# Patient Record
Sex: Male | Born: 1985 | Race: Black or African American | Hispanic: No | Marital: Single | Smoking: Current some day smoker
Health system: Southern US, Community
[De-identification: ages and names within clinical notes are randomized; demographics above are authoritative.]

## PROBLEM LIST (undated history)

## (undated) DIAGNOSIS — Z6841 Body Mass Index (BMI) 40.0 and over, adult: Secondary | ICD-10-CM

## (undated) DIAGNOSIS — E119 Type 2 diabetes mellitus without complications: Secondary | ICD-10-CM

---
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
## 2009-04-14 ENCOUNTER — Emergency Department (HOSPITAL_COMMUNITY): Admission: EM | Admit: 2009-04-14 | Discharge: 2009-04-14 | Payer: Self-pay | Admitting: Emergency Medicine

## 2009-04-19 ENCOUNTER — Emergency Department (HOSPITAL_COMMUNITY): Admission: EM | Admit: 2009-04-19 | Discharge: 2009-04-19 | Payer: Self-pay | Admitting: Family Medicine

## 2010-08-09 ENCOUNTER — Emergency Department (HOSPITAL_COMMUNITY)
Admission: EM | Admit: 2010-08-09 | Discharge: 2010-08-10 | Payer: Self-pay | Source: Home / Self Care | Admitting: Emergency Medicine

## 2015-09-10 ENCOUNTER — Institutional Professional Consult (permissible substitution): Payer: Self-pay | Admitting: Family Medicine

## 2015-11-18 ENCOUNTER — Ambulatory Visit (INDEPENDENT_AMBULATORY_CARE_PROVIDER_SITE_OTHER): Payer: BLUE CROSS/BLUE SHIELD | Admitting: Podiatry

## 2015-11-18 ENCOUNTER — Ambulatory Visit (INDEPENDENT_AMBULATORY_CARE_PROVIDER_SITE_OTHER): Payer: BLUE CROSS/BLUE SHIELD

## 2015-11-18 ENCOUNTER — Encounter: Payer: Self-pay | Admitting: Podiatry

## 2015-11-18 VITALS — BP 139/87 | HR 100 | Resp 16 | Ht 75.0 in | Wt 380.0 lb

## 2015-11-18 DIAGNOSIS — B351 Tinea unguium: Secondary | ICD-10-CM

## 2015-11-18 DIAGNOSIS — M79604 Pain in right leg: Secondary | ICD-10-CM

## 2015-11-18 DIAGNOSIS — M779 Enthesopathy, unspecified: Secondary | ICD-10-CM

## 2015-11-18 DIAGNOSIS — L84 Corns and callosities: Secondary | ICD-10-CM

## 2015-11-18 DIAGNOSIS — M79674 Pain in right toe(s): Secondary | ICD-10-CM

## 2015-11-18 DIAGNOSIS — M79675 Pain in left toe(s): Secondary | ICD-10-CM | POA: Diagnosis not present

## 2015-11-18 DIAGNOSIS — M79605 Pain in left leg: Secondary | ICD-10-CM

## 2015-11-18 MED ORDER — TERBINAFINE HCL 250 MG PO TABS: 250.0000 mg | ORAL_TABLET | Freq: Every day | ORAL | Status: DC

## 2015-11-18 NOTE — Progress Notes (Signed)
   Subjective:    Patient ID: Rodney Delgado, male    DOB: [birth year], 30 y.o.   MRN: [id number]  HPI Patient presents with bilateral callouses; plantar forefoot; x1 yr.  Patient also presents with bilateral foot pain; plantar forefoot; Since Aug. 2016.  Patient also presents with bilaeral skin that is itching on bottom of feet; Since Aug. 2016.   Review of Systems  Musculoskeletal: Positive for gait problem.  All other systems reviewed and are negative.      Objective:   Physical Exam        Assessment & Plan:

## 2015-11-20 NOTE — Progress Notes (Signed)
Subjective:     Patient ID: Rodney Delgado, male   DOB: 03/24/1986, 30 y.o.   MRN: [id number]  HPI obese male presents with severe thickening of the plantar tissue bilateral nail disease bilateral and pain in his forefeet bilateral. States it's is been going on for a long time and getting worse   Review of Systems  All other systems reviewed and are negative.      Objective:   Physical Exam  Constitutional: He is oriented to person, place, and time.  Cardiovascular: Intact distal pulses.   Musculoskeletal: Normal range of motion.  Neurological: He is oriented to person, place, and time.  Skin: Skin is warm.  Nursing note and vitals reviewed.  Neurovascular status intact muscle strength adequate range of motion within normal limits with patient found to have significant keratotic lesion formation of the plantar third metatarsal bilateral and also noted to have generalized thick and tight skin. Found to have severe nails with thickness in yellow brittle debris with pain of nailbeds 1-5 both feet and states it's been this way for a number of years and gradually becoming more of an issue for him. 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 Debridement of lesions accomplished and debridement of nailbeds accomplished with no iatrogenic bleeding noted and we will place him on Lamisil 250 mg 1 per day for the next 90 days and we are getting a liver function studies done prior. Hopefully this will help clear up his skin and possibly a portion of his nails even though I do not expect complete clearance

## 2015-11-22 LAB — HEPATIC FUNCTION PANEL
ALT: 31 U/L (ref 9–46)
AST: 21 U/L (ref 10–40)
Albumin: 4.2 g/dL (ref 3.6–5.1)
Alkaline Phosphatase: 79 U/L (ref 40–115)
BILIRUBIN DIRECT: 0.1 mg/dL (ref <=0.2)
BILIRUBIN TOTAL: 0.3 mg/dL (ref 0.2–1.2)
Indirect Bilirubin: 0.2 mg/dL (ref 0.2–1.2)
Total Protein: 6.9 g/dL (ref 6.1–8.1)

## 2015-11-27 ENCOUNTER — Encounter: Payer: Self-pay | Admitting: *Deleted

## 2015-11-27 ENCOUNTER — Telehealth: Payer: Self-pay | Admitting: *Deleted

## 2015-11-27 NOTE — Telephone Encounter (Signed)
Dr. Charlsie Merlesegal reviewed pt's Hepatic function 11/21/2015 and states may begin terbinafine.  Pt's contact number is not accepting calls, mailed note informing pt he may begin the medication.

## 2016-02-24 ENCOUNTER — Ambulatory Visit: Payer: BLUE CROSS/BLUE SHIELD | Admitting: Podiatry

## 2016-08-22 DIAGNOSIS — N529 Male erectile dysfunction, unspecified: Secondary | ICD-10-CM | POA: Insufficient documentation

## 2017-01-19 DIAGNOSIS — Z0000 Encounter for general adult medical examination without abnormal findings: Secondary | ICD-10-CM | POA: Insufficient documentation

## 2017-02-06 DIAGNOSIS — Z6841 Body Mass Index (BMI) 40.0 and over, adult: Secondary | ICD-10-CM

## 2017-02-06 DIAGNOSIS — E781 Pure hyperglyceridemia: Secondary | ICD-10-CM | POA: Insufficient documentation

## 2017-02-06 DIAGNOSIS — E1165 Type 2 diabetes mellitus with hyperglycemia: Secondary | ICD-10-CM | POA: Insufficient documentation

## 2017-11-22 ENCOUNTER — Encounter (HOSPITAL_COMMUNITY): Payer: Self-pay

## 2017-11-22 ENCOUNTER — Other Ambulatory Visit: Payer: Self-pay

## 2017-11-22 DIAGNOSIS — E1165 Type 2 diabetes mellitus with hyperglycemia: Secondary | ICD-10-CM | POA: Diagnosis present

## 2017-11-22 DIAGNOSIS — Z794 Long term (current) use of insulin: Secondary | ICD-10-CM

## 2017-11-22 DIAGNOSIS — Z87892 Personal history of anaphylaxis: Secondary | ICD-10-CM

## 2017-11-22 DIAGNOSIS — Z880 Allergy status to penicillin: Secondary | ICD-10-CM

## 2017-11-22 DIAGNOSIS — L0231 Cutaneous abscess of buttock: Secondary | ICD-10-CM | POA: Diagnosis present

## 2017-11-22 DIAGNOSIS — Z888 Allergy status to other drugs, medicaments and biological substances status: Secondary | ICD-10-CM

## 2017-11-22 DIAGNOSIS — L02215 Cutaneous abscess of perineum: Principal | ICD-10-CM | POA: Diagnosis present

## 2017-11-22 DIAGNOSIS — L03317 Cellulitis of buttock: Secondary | ICD-10-CM | POA: Diagnosis present

## 2017-11-22 DIAGNOSIS — Z6841 Body Mass Index (BMI) 40.0 and over, adult: Secondary | ICD-10-CM

## 2017-11-22 DIAGNOSIS — F17200 Nicotine dependence, unspecified, uncomplicated: Secondary | ICD-10-CM | POA: Diagnosis present

## 2017-11-22 NOTE — ED Triage Notes (Signed)
Patient presents with right buttocks abscess x4 days. Patient reports he usually gets these abscesses "and I use hot water to bring it to a head, then it pops, but not this one." Patient reports trying to use a needle to pop the abscess today, but reports he only saw blood come from the site, so he decided to come in. Patient denies fevers/chills. Patient denies nausea/vomiting/diarrhea.

## 2017-11-23 ENCOUNTER — Observation Stay (HOSPITAL_COMMUNITY): Payer: BLUE CROSS/BLUE SHIELD | Admitting: Certified Registered"

## 2017-11-23 ENCOUNTER — Encounter (HOSPITAL_COMMUNITY): Payer: Self-pay

## 2017-11-23 ENCOUNTER — Encounter (HOSPITAL_COMMUNITY)
Admission: EM | Disposition: A | Discharge status: Home / Self Care | Payer: Self-pay | Source: Home / Self Care | Attending: Internal Medicine

## 2017-11-23 ENCOUNTER — Emergency Department (HOSPITAL_COMMUNITY): Payer: BLUE CROSS/BLUE SHIELD

## 2017-11-23 ENCOUNTER — Inpatient Hospital Stay (HOSPITAL_COMMUNITY)
Admission: EM | Admit: 2017-11-23 | Discharge: 2017-11-25 | DRG: 603 | Disposition: A | Discharge status: Home / Self Care | Payer: BLUE CROSS/BLUE SHIELD | Attending: Internal Medicine | Admitting: Internal Medicine

## 2017-11-23 DIAGNOSIS — L0231 Cutaneous abscess of buttock: Secondary | ICD-10-CM | POA: Diagnosis present

## 2017-11-23 DIAGNOSIS — L03317 Cellulitis of buttock: Secondary | ICD-10-CM

## 2017-11-23 DIAGNOSIS — E119 Type 2 diabetes mellitus without complications: Secondary | ICD-10-CM

## 2017-11-23 HISTORY — DX: Body Mass Index (BMI) 40.0 and over, adult: Z684

## 2017-11-23 HISTORY — DX: Morbid (severe) obesity due to excess calories: E66.01

## 2017-11-23 HISTORY — DX: Type 2 diabetes mellitus without complications: E11.9

## 2017-11-23 HISTORY — PX: IRRIGATION AND DEBRIDEMENT ABSCESS: SHX5252

## 2017-11-23 LAB — I-STAT CHEM 8, ED
BUN: 9 mg/dL (ref 6–20)
CHLORIDE: 100 mmol/L — AB (ref 101–111)
Calcium, Ion: 1.21 mmol/L (ref 1.15–1.40)
Creatinine, Ser: 0.8 mg/dL (ref 0.61–1.24)
Glucose, Bld: 152 mg/dL — ABNORMAL HIGH (ref 65–99)
HCT: 46 % (ref 39.0–52.0)
Hemoglobin: 15.6 g/dL (ref 13.0–17.0)
POTASSIUM: 3.6 mmol/L (ref 3.5–5.1)
SODIUM: 141 mmol/L (ref 135–145)
TCO2: 29 mmol/L (ref 22–32)

## 2017-11-23 LAB — CBC WITH DIFFERENTIAL/PLATELET
BASOS ABS: 0 10*3/uL (ref 0.0–0.1)
BASOS PCT: 0 %
EOS ABS: 0.3 10*3/uL (ref 0.0–0.7)
EOS PCT: 3 %
HCT: 42.3 % (ref 39.0–52.0)
Hemoglobin: 14.3 g/dL (ref 13.0–17.0)
Lymphocytes Relative: 30 %
Lymphs Abs: 3.6 10*3/uL (ref 0.7–4.0)
MCH: 28.5 pg (ref 26.0–34.0)
MCHC: 33.8 g/dL (ref 30.0–36.0)
MCV: 84.3 fL (ref 78.0–100.0)
Monocytes Absolute: 1.1 10*3/uL — ABNORMAL HIGH (ref 0.1–1.0)
Monocytes Relative: 9 %
Neutro Abs: 6.8 10*3/uL (ref 1.7–7.7)
Neutrophils Relative %: 58 %
PLATELETS: 276 10*3/uL (ref 150–400)
RBC: 5.02 MIL/uL (ref 4.22–5.81)
RDW: 12.7 % (ref 11.5–15.5)
WBC: 11.9 10*3/uL — AB (ref 4.0–10.5)

## 2017-11-23 LAB — GLUCOSE, CAPILLARY
GLUCOSE-CAPILLARY: 144 mg/dL — AB (ref 65–99)
GLUCOSE-CAPILLARY: 179 mg/dL — AB (ref 65–99)
Glucose-Capillary: 159 mg/dL — ABNORMAL HIGH (ref 65–99)
Glucose-Capillary: 183 mg/dL — ABNORMAL HIGH (ref 65–99)
Glucose-Capillary: 124 mg/dL — ABNORMAL HIGH (ref 65–99)
Glucose-Capillary: 110 mg/dL — ABNORMAL HIGH (ref 65–99)

## 2017-11-23 LAB — SURGICAL PCR SCREEN
MRSA, PCR: NEGATIVE
STAPHYLOCOCCUS AUREUS: POSITIVE — AB

## 2017-11-23 LAB — CBG MONITORING, ED
GLUCOSE-CAPILLARY: 157 mg/dL — AB (ref 65–99)
Glucose-Capillary: 180 mg/dL — ABNORMAL HIGH (ref 65–99)
Glucose-Capillary: 171 mg/dL — ABNORMAL HIGH (ref 65–99)

## 2017-11-23 LAB — HEMOGLOBIN A1C
HEMOGLOBIN A1C: 7.5 % — AB (ref 4.8–5.6)
MEAN PLASMA GLUCOSE: 168.55 mg/dL

## 2017-11-23 LAB — HIV ANTIBODY (ROUTINE TESTING W REFLEX): HIV SCREEN 4TH GENERATION: NONREACTIVE

## 2017-11-23 SURGERY — IRRIGATION AND DEBRIDEMENT ABSCESS
Anesthesia: General

## 2017-11-23 MED ORDER — IOPAMIDOL (ISOVUE-300) INJECTION 61%
INTRAVENOUS | Status: AC
  Filled 2017-11-23: qty 100

## 2017-11-23 MED ORDER — LIDOCAINE 2% (20 MG/ML) 5 ML SYRINGE
INTRAMUSCULAR | Status: AC
  Filled 2017-11-23: qty 5

## 2017-11-23 MED ORDER — PROPOFOL 10 MG/ML IV BOLUS
INTRAVENOUS | Status: AC
  Filled 2017-11-23: qty 20

## 2017-11-23 MED ORDER — OXYCODONE HCL 5 MG PO TABS
5.0000 mg | ORAL_TABLET | ORAL | Status: DC | PRN
  Administered 2017-11-25: 5 mg via ORAL
  Filled 2017-11-23: qty 1

## 2017-11-23 MED ORDER — MORPHINE SULFATE (PF) 2 MG/ML IV SOLN
2.0000 mg | INTRAVENOUS | Status: DC | PRN
  Administered 2017-11-23: 2 mg via INTRAVENOUS
  Filled 2017-11-23: qty 1

## 2017-11-23 MED ORDER — INSULIN ASPART 100 UNIT/ML ~~LOC~~ SOLN
0.0000 [IU] | SUBCUTANEOUS | Status: DC
  Administered 2017-11-23 (×2): 2 [IU] via SUBCUTANEOUS
  Administered 2017-11-23 (×2): 1 [IU] via SUBCUTANEOUS
  Administered 2017-11-23 – 2017-11-25 (×9): 2 [IU] via SUBCUTANEOUS
  Administered 2017-11-25: 1 [IU] via SUBCUTANEOUS
  Administered 2017-11-25: 2 [IU] via SUBCUTANEOUS
  Filled 2017-11-23: qty 1

## 2017-11-23 MED ORDER — VANCOMYCIN HCL IN DEXTROSE 1-5 GM/200ML-% IV SOLN
1000.0000 mg | Freq: Once | INTRAVENOUS | Status: AC
  Administered 2017-11-23: 1000 mg via INTRAVENOUS
  Filled 2017-11-23: qty 200

## 2017-11-23 MED ORDER — HYDROMORPHONE HCL 1 MG/ML IJ SOLN: 1.0000 mg | INTRAMUSCULAR | Status: DC | PRN

## 2017-11-23 MED ORDER — FENTANYL CITRATE (PF) 100 MCG/2ML IJ SOLN
INTRAMUSCULAR | Status: AC
  Filled 2017-11-23: qty 2

## 2017-11-23 MED ORDER — DEXAMETHASONE SODIUM PHOSPHATE 10 MG/ML IJ SOLN
INTRAMUSCULAR | Status: AC
  Filled 2017-11-23: qty 1

## 2017-11-23 MED ORDER — IOPAMIDOL (ISOVUE-300) INJECTION 61%
100.0000 mL | Freq: Once | INTRAVENOUS | Status: AC | PRN
  Administered 2017-11-23: 100 mL via INTRAVENOUS

## 2017-11-23 MED ORDER — PREMIER PROTEIN SHAKE
11.0000 [oz_av] | ORAL | Status: DC
  Administered 2017-11-24 – 2017-11-25 (×2): 11 [oz_av] via ORAL

## 2017-11-23 MED ORDER — ADULT MULTIVITAMIN W/MINERALS CH
1.0000 | ORAL_TABLET | Freq: Every day | ORAL | Status: DC
  Administered 2017-11-24 – 2017-11-25 (×2): 1 via ORAL
  Filled 2017-11-23 (×2): qty 1

## 2017-11-23 MED ORDER — LIDOCAINE HCL (CARDIAC) 20 MG/ML IV SOLN
INTRAVENOUS | Status: DC | PRN
  Administered 2017-11-23: 30 mg via INTRAVENOUS

## 2017-11-23 MED ORDER — MIDAZOLAM HCL 2 MG/2ML IJ SOLN: 0.5000 mg | Freq: Once | INTRAMUSCULAR | Status: DC | PRN

## 2017-11-23 MED ORDER — VANCOMYCIN HCL 10 G IV SOLR
1250.0000 mg | Freq: Three times a day (TID) | INTRAVENOUS | Status: DC
  Administered 2017-11-23 – 2017-11-24 (×3): 1250 mg via INTRAVENOUS
  Filled 2017-11-23 (×6): qty 1250

## 2017-11-23 MED ORDER — ACETAMINOPHEN 325 MG PO TABS: 650.0000 mg | ORAL_TABLET | Freq: Four times a day (QID) | ORAL | Status: DC | PRN

## 2017-11-23 MED ORDER — CHLORHEXIDINE GLUCONATE CLOTH 2 % EX PADS
6.0000 | MEDICATED_PAD | Freq: Every day | CUTANEOUS | Status: DC
  Administered 2017-11-23 – 2017-11-25 (×3): 6 via TOPICAL

## 2017-11-23 MED ORDER — ONDANSETRON HCL 4 MG/2ML IJ SOLN: 4.0000 mg | Freq: Four times a day (QID) | INTRAMUSCULAR | Status: DC | PRN

## 2017-11-23 MED ORDER — MIDAZOLAM HCL 2 MG/2ML IJ SOLN
INTRAMUSCULAR | Status: AC
  Filled 2017-11-23: qty 2

## 2017-11-23 MED ORDER — ONDANSETRON HCL 4 MG/2ML IJ SOLN
INTRAMUSCULAR | Status: DC | PRN
Start: 2017-11-23 — End: 2017-11-23
  Administered 2017-11-23: 4 mg via INTRAVENOUS

## 2017-11-23 MED ORDER — HYDROMORPHONE HCL 1 MG/ML IJ SOLN
0.2500 mg | INTRAMUSCULAR | Status: DC | PRN
  Administered 2017-11-23: 0.5 mg via INTRAVENOUS

## 2017-11-23 MED ORDER — SODIUM CHLORIDE 0.9 % IV SOLN
INTRAVENOUS | Status: DC
  Administered 2017-11-23: 1000 mL via INTRAVENOUS
  Administered 2017-11-23: 03:00:00 via INTRAVENOUS
  Administered 2017-11-23: 1000 mL via INTRAVENOUS
  Administered 2017-11-23: 11:00:00 via INTRAVENOUS
  Administered 2017-11-24: 1000 mL via INTRAVENOUS
  Administered 2017-11-24: 18:00:00 via INTRAVENOUS

## 2017-11-23 MED ORDER — JUVEN PO PACK
1.0000 | PACK | Freq: Two times a day (BID) | ORAL | Status: DC
  Administered 2017-11-24 – 2017-11-25 (×4): 1 via ORAL
  Filled 2017-11-23 (×5): qty 1

## 2017-11-23 MED ORDER — ONDANSETRON HCL 4 MG PO TABS: 4.0000 mg | ORAL_TABLET | Freq: Four times a day (QID) | ORAL | Status: DC | PRN

## 2017-11-23 MED ORDER — MIDAZOLAM HCL 5 MG/5ML IJ SOLN
INTRAMUSCULAR | Status: DC | PRN
  Administered 2017-11-23 (×2): 1 mg via INTRAVENOUS

## 2017-11-23 MED ORDER — PROPOFOL 10 MG/ML IV BOLUS
INTRAVENOUS | Status: DC | PRN
  Administered 2017-11-23: 350 mg via INTRAVENOUS

## 2017-11-23 MED ORDER — FENTANYL CITRATE (PF) 100 MCG/2ML IJ SOLN
INTRAMUSCULAR | Status: DC | PRN
  Administered 2017-11-23 (×2): 50 ug via INTRAVENOUS

## 2017-11-23 MED ORDER — MUPIROCIN 2 % EX OINT
1.0000 "application " | TOPICAL_OINTMENT | Freq: Two times a day (BID) | CUTANEOUS | Status: DC
  Administered 2017-11-23 – 2017-11-25 (×4): 1 via NASAL
  Filled 2017-11-23 (×3): qty 22

## 2017-11-23 MED ORDER — HYDROMORPHONE HCL 1 MG/ML IJ SOLN
INTRAMUSCULAR | Status: AC
  Filled 2017-11-23: qty 1

## 2017-11-23 MED ORDER — PROMETHAZINE HCL 25 MG/ML IJ SOLN: 6.2500 mg | INTRAMUSCULAR | Status: DC | PRN

## 2017-11-23 MED ORDER — ACETAMINOPHEN 650 MG RE SUPP: 650.0000 mg | Freq: Four times a day (QID) | RECTAL | Status: DC | PRN

## 2017-11-23 MED ORDER — DEXAMETHASONE SODIUM PHOSPHATE 10 MG/ML IJ SOLN
INTRAMUSCULAR | Status: DC | PRN
  Administered 2017-11-23: 10 mg via INTRAVENOUS

## 2017-11-23 MED ORDER — LEVOFLOXACIN IN D5W 750 MG/150ML IV SOLN
750.0000 mg | Freq: Once | INTRAVENOUS | Status: AC
  Administered 2017-11-23: 750 mg via INTRAVENOUS
  Filled 2017-11-23: qty 150

## 2017-11-23 MED ORDER — MEPERIDINE HCL 50 MG/ML IJ SOLN: 6.2500 mg | INTRAMUSCULAR | Status: DC | PRN

## 2017-11-23 MED ORDER — VANCOMYCIN HCL 10 G IV SOLR
1500.0000 mg | INTRAVENOUS | Status: AC
  Administered 2017-11-23: 1500 mg via INTRAVENOUS
  Filled 2017-11-23: qty 1500

## 2017-11-23 MED ORDER — ONDANSETRON HCL 4 MG/2ML IJ SOLN
INTRAMUSCULAR | Status: AC
  Filled 2017-11-23: qty 2

## 2017-11-23 SURGICAL SUPPLY — 34 items
BLADE HEX COATED 2.75 (ELECTRODE) ×3 IMPLANT
BLADE SURG SZ10 CARB STEEL (BLADE) ×3 IMPLANT
CHLORAPREP W/TINT 26ML (MISCELLANEOUS) ×3 IMPLANT
CLOSURE WOUND 1/2 X4 (GAUZE/BANDAGES/DRESSINGS)
COVER SURGICAL LIGHT HANDLE (MISCELLANEOUS) ×3 IMPLANT
DECANTER SPIKE VIAL GLASS SM (MISCELLANEOUS) IMPLANT
DRAIN CHANNEL RND F F (WOUND CARE) IMPLANT
DRAPE LAPAROTOMY T 102X78X121 (DRAPES) IMPLANT
DRAPE LAPAROTOMY TRNSV 102X78 (DRAPE) IMPLANT
DRAPE LEGGING IMPERMEABLE (DRAPES) ×3 IMPLANT
DRAPE SHEET LG 3/4 BI-LAMINATE (DRAPES) IMPLANT
ELECT REM PT RETURN 15FT ADLT (MISCELLANEOUS) ×3 IMPLANT
EVACUATOR SILICONE 100CC (DRAIN) IMPLANT
GAUZE PACKING IODOFORM 1/4X15 (GAUZE/BANDAGES/DRESSINGS) ×3 IMPLANT
GAUZE SPONGE 4X4 12PLY STRL (GAUZE/BANDAGES/DRESSINGS) ×3 IMPLANT
GLOVE BIOGEL PI IND STRL 7.0 (GLOVE) ×1 IMPLANT
GLOVE BIOGEL PI INDICATOR 7.0 (GLOVE) ×2
GLOVE SURG ORTHO 8.0 STRL STRW (GLOVE) ×3 IMPLANT
GOWN STRL REUS W/TWL LRG LVL3 (GOWN DISPOSABLE) ×3 IMPLANT
GOWN STRL REUS W/TWL XL LVL3 (GOWN DISPOSABLE) ×6 IMPLANT
KIT BASIN OR (CUSTOM PROCEDURE TRAY) ×3 IMPLANT
MARKER SKIN DUAL TIP RULER LAB (MISCELLANEOUS) IMPLANT
NEEDLE HYPO 25X1 1.5 SAFETY (NEEDLE) ×3 IMPLANT
NS IRRIG 1000ML POUR BTL (IV SOLUTION) ×3 IMPLANT
PACK GENERAL/GYN (CUSTOM PROCEDURE TRAY) ×3 IMPLANT
PAD ABD 8X10 STRL (GAUZE/BANDAGES/DRESSINGS) ×3 IMPLANT
SPONGE LAP 18X18 X RAY DECT (DISPOSABLE) IMPLANT
STAPLER VISISTAT 35W (STAPLE) IMPLANT
STRIP CLOSURE SKIN 1/2X4 (GAUZE/BANDAGES/DRESSINGS) IMPLANT
SUT ETHILON 3 0 PS 1 (SUTURE) IMPLANT
SUT MNCRL AB 4-0 PS2 18 (SUTURE) IMPLANT
SUT VIC AB 3-0 SH 18 (SUTURE) IMPLANT
SYR CONTROL 10ML LL (SYRINGE) ×3 IMPLANT
TOWEL OR 17X26 10 PK STRL BLUE (TOWEL DISPOSABLE) ×3 IMPLANT

## 2017-11-23 NOTE — Progress Notes (Signed)
[facility], [facility] and [facility]. Notified surgical PA, will continue efforts until d/ced.

## 2017-11-23 NOTE — Anesthesia Postprocedure Evaluation (Signed)
Anesthesia Post Note  Patient: Rodney Delgado  Procedure(s) Performed: IRRIGATION AND DEBRIDEMENTPERINEAL ABSCESS (N/A )     Patient location during evaluation: PACU Anesthesia Type: General Level of consciousness: awake and alert, patient cooperative and oriented Pain management: pain level controlled Vital Signs Assessment: post-procedure vital signs reviewed and stable Respiratory status: spontaneous breathing, nonlabored ventilation and respiratory function stable Cardiovascular status: blood pressure returned to baseline and stable Postop Assessment: no apparent nausea or vomiting Anesthetic complications: no    Last Vitals:  Vitals:   11/23/17 1830 11/23/17 1845  BP: (!) 152/94 (!) 148/87  Pulse: 95 88  Resp: 12 15  Temp:    SpO2: 100% 100%    Last Pain:  Vitals:   11/23/17 1845  TempSrc:   PainSc: 4                  Emalene Welte,E. Jahari Wiginton

## 2017-11-23 NOTE — Anesthesia Preprocedure Evaluation (Addendum)
Anesthesia Evaluation  Patient identified by MRN, date of birth, ID band Patient awake    Reviewed: Allergy & Precautions, NPO status , Patient's Chart, lab work & pertinent test results  History of Anesthesia Complications Negative for: history of anesthetic complications  Airway Mallampati: I  TM Distance: >3 FB Neck ROM: Full    Dental  (+) Dental Advisory Given   Pulmonary Current Smoker,    breath sounds clear to auscultation       Cardiovascular (-) anginanegative cardio ROS   Rhythm:Regular Rate:Normal     Neuro/Psych negative neurological ROS     GI/Hepatic negative GI ROS, Neg liver ROS,   Endo/Other  diabetes (glu 124), Poorly ControlledMorbid obesity  Renal/GU negative Renal ROS     Musculoskeletal   Abdominal (+) + obese,   Peds  Hematology negative hematology ROS (+)   Anesthesia Other Findings   Reproductive/Obstetrics                            Anesthesia Physical Anesthesia Plan  ASA: III  Anesthesia Plan: General   Post-op Pain Management:    Induction: Intravenous  PONV Risk Score and Plan: 2 and Ondansetron and Dexamethasone  Airway Management Planned: LMA  Additional Equipment:   Intra-op Plan:   Post-operative Plan:   Informed Consent: I have reviewed the patients History and Physical, chart, labs and discussed the procedure including the risks, benefits and alternatives for the proposed anesthesia with the patient or authorized representative who has indicated his/her understanding and acceptance.   Dental advisory given  Plan Discussed with: CRNA and Surgeon  Anesthesia Plan Comments: (Plan routine monitors,GA- LMA OK)        Anesthesia Quick Evaluation

## 2017-11-23 NOTE — Op Note (Signed)
11/23/2017  6:09 PM  PATIENT:  Liz Beacherrence Brunsman  32 y.o. male  PRE-OPERATIVE DIAGNOSIS:  PERINEAL ABCESS  POST-OPERATIVE DIAGNOSIS:  PERINEAL ABCESS  PROCEDURE:  Procedure(s): IRRIGATION AND DEBRIDEMENTPERINEAL ABSCESS (N/A)  SURGEON:  Surgeon(s) and Role:    * Axel Filleramirez, Nicloe Frontera, MD - Primary   ASSISTANTS: none   ANESTHESIA:   general  EBL:  15cc   BLOOD ADMINISTERED:none  DRAINS:Quarter-inch iodoform gauze within the abscess cavity  LOCAL MEDICATIONS USED:  NONE  SPECIMEN:  Source of Specimen: Perineal abscess  DISPOSITION OF SPECIMEN: Microbiology  COUNTS:  YES  TOURNIQUET:  * No tourniquets in log *  DICTATION: .Dragon Dictation Indication procedure: Patient is a 32 year old male with a right perineal abscess.  Patient was admitted from the ER underwent CT scan which revealed a large perineal abscess.  Secondary to the this and his comorbidities patient was taken back to the operating room urgently for I&D of perineal abscess.  Details of procedure: As the patient was consented he was taken back to the operating room and placed in supine position with bilateral SCDs in place.  He underwent general endotracheal anesthesia.  HE was placed in the lithotomy position.  Patient was prepped and draped standard fashion.  A timeout was called all facts verified.  At this time the area of the abscess that was already initially draining, was opened up with a Kelly hemostat.  A large amount of foul smelling purulence was expressed.  Cultures were then taken from this area.  At this time it area appeared to track anteriorly.  Incision was made in the abscess cavity anteriorly.  Large amount of purulence was also evacuated in this region.  Hemostats were used and blunt dissection was used to break up all loculations.  At this time the area was irrigated out with sterile saline.  The area was packed with quarter-inch iodoform gauze, x1 bottle.  The area was then dressed with 4 x 4's,  ABD pad, and mesh panties.  Patient taught procedure well was taken to the recovery in stable condition.   PLAN OF CARE: Admit to inpatient   PATIENT DISPOSITION:  PACU - hemodynamically stable.   Delay start of Pharmacological VTE agent (>24hrs) due to surgical blood loss or risk of bleeding: yes

## 2017-11-23 NOTE — H&P (Signed)
History and Physical    Rodney Delgado ZOX:[id number] DOB: June 23, 1986 DOA: 11/23/2017  PCP: Patient, No Pcp Per  Patient coming from: Home  I have personally briefly reviewed patient's old medical records in [facility]  Chief Complaint: Abscess and cellulitis of buttocks  HPI: Rodney Delgado is a 32 y.o. male with medical history significant of DM2 not on meds for about a year now.  Patient presents to the ED with c/o abscess and cellulitis of his R buttock / perineal area.  Symptoms ongoing for past 4 days, constant, dull pain, worsening.  Gets occasional abscesses but usually able to draw them to a head and drain himself.  Not so this time.   ED Course: CT shows abscess.  EDP called general surgery who said to have medicine admit and they will see in AM.   Review of Systems: As per HPI otherwise 10 point review of systems negative.   Past Medical History:  Diagnosis Date  . DM2 (diabetes mellitus, type 2) (HCC)   . Morbid obesity with BMI of 40.0-44.9, adult (HCC)     No past surgical history on file.   reports that he has been smoking.  He does not have any smokeless tobacco history on file. He reports that he drinks alcohol. He reports that he does not use drugs.  Allergies  Allergen Reactions  . Nsaids Anaphylaxis  . Penicillins Rash    Has patient had a PCN reaction causing immediate rash, facial/tongue/throat swelling, SOB or lightheadedness with hypotension: Yes Has patient had a PCN reaction causing severe rash involving mucus membranes or skin necrosis: No Has patient had a PCN reaction that required hospitalization: No Has patient had a PCN reaction occurring within the last 10 years: No If all of the above answers are "NO", then may proceed with Cephalosporin use.     No family history on file.   Prior to Admission medications   Medication Sig Start Date End Date Taking? Authorizing Provider  terbinafine (LAMISIL) 250 MG tablet Take 1 tablet (250 mg  total) by mouth daily. Patient not taking: Reported on 11/23/2017 11/18/15   Lenn Sink, [place]    Physical Exam: Vitals:   11/22/17 2050 11/22/17 2052 11/23/17 0204 11/23/17 0230  BP:  (!) 166/119 132/77 133/81  Pulse:  (!) 108 100 96  Resp:  16 16 18   Temp:  99.3 F (37.4 C) 98.9 F (37.2 C)   TempSrc:  Oral Oral   SpO2:  97% 97% 94%  Weight: (!) 157.7 kg (347 lb 11.2 oz)     Height: 6\' 3"  (1.905 m)       Constitutional: NAD, calm, comfortable Eyes: PERRL, lids and conjunctivae normal ENMT: Mucous membranes are moist. Posterior pharynx clear of any exudate or lesions.Normal dentition.  Neck: normal, supple, no masses, no thyromegaly Respiratory: clear to auscultation bilaterally, no wheezing, no crackles. Normal respiratory effort. No accessory muscle use.  Cardiovascular: Regular rate and rhythm, no murmurs / rubs / gallops. No extremity edema. 2+ pedal pulses. No carotid bruits.  Abdomen: no tenderness, no masses palpated. No hepatosplenomegaly. Bowel sounds positive.  Musculoskeletal: no clubbing / cyanosis. No joint deformity upper and lower extremities. Good ROM, no contractures. Normal muscle tone.  Skin: erythema, redness, of perineum and buttocks Neurologic: CN 2-12 grossly intact. Sensation intact, DTR normal. Strength 5/5 in all 4.  Psychiatric: Normal judgment and insight. Alert and oriented x 3. Normal mood.    Labs on Admission: I have personally reviewed following  labs and imaging studies  CBC: Recent Labs  Lab 11/23/17 0116 11/23/17 0125  WBC 11.9*  --   NEUTROABS 6.8  --   HGB 14.3 15.6  HCT 42.3 46.0  MCV 84.3  --   PLT 276  --    Basic Metabolic Panel: Recent Labs  Lab 11/23/17 0125  NA 141  K 3.6  CL 100*  GLUCOSE 152*  BUN 9  CREATININE 0.80   GFR: Estimated Creatinine Clearance: 219.3 mL/min (by C-G formula based on SCr of 0.8 mg/dL). Liver Function Tests: No results for input(s): AST, ALT, ALKPHOS, BILITOT, PROT, ALBUMIN in the last  168 hours. No results for input(s): LIPASE, AMYLASE in the last 168 hours. No results for input(s): AMMONIA in the last 168 hours. Coagulation Profile: No results for input(s): INR, PROTIME in the last 168 hours. Cardiac Enzymes: No results for input(s): CKTOTAL, CKMB, CKMBINDEX, TROPONINI in the last 168 hours. BNP (last 3 results) No results for input(s): PROBNP in the last 8760 hours. HbA1C: No results for input(s): HGBA1C in the last 72 hours. CBG: Recent Labs  Lab 11/23/17 0117 11/23/17 0318  GLUCAP 157* 180*   Lipid Profile: No results for input(s): CHOL, HDL, LDLCALC, TRIG, CHOLHDL, LDLDIRECT in the last 72 hours. Thyroid Function Tests: No results for input(s): TSH, T4TOTAL, FREET4, T3FREE, THYROIDAB in the last 72 hours. Anemia Panel: No results for input(s): VITAMINB12, FOLATE, FERRITIN, TIBC, IRON, RETICCTPCT in the last 72 hours. Urine analysis: No results found for: COLORURINE, APPEARANCEUR, LABSPEC, PHURINE, GLUCOSEU, HGBUR, BILIRUBINUR, KETONESUR, PROTEINUR, UROBILINOGEN, NITRITE, LEUKOCYTESUR  Radiological Exams on Admission: Ct Abdomen Pelvis W Contrast  Result Date: 11/23/2017 CLINICAL DATA:  Right buttock abscess for 4 days. EXAM: CT ABDOMEN AND PELVIS WITH CONTRAST TECHNIQUE: Multidetector CT imaging of the abdomen and pelvis was performed using the standard protocol following bolus administration of intravenous contrast. CONTRAST:  100 cc ISOVUE-300 IOPAMIDOL (ISOVUE-300) INJECTION 61% COMPARISON:  None. FINDINGS: Lower chest: Normal heart size without pericardial effusion. Clear lung bases. Hepatobiliary: Homogeneous appearance of the liver without space-occupying mass. Normal gallbladder. No biliary dilatation. Pancreas: Normal Spleen: Normal Adrenals/Urinary Tract: Normal Stomach/Bowel: Colonic diverticulosis along the sigmoid colon without acute diverticulitis. Normal appendix. Unremarkable stomach and small intestine. Vascular/Lymphatic: Normal caliber aorta  and iliac arteries. No pelvic sidewall lymphadenopathy or inguinal adenopathy. Reproductive: Unremarkable Other: Subcutaneous abscess at the junction of the right buttock and perineum measuring 2.9 x 7.4 x 4.5 cm with cellulitic subcutaneous soft tissue induration involving the adjacent buttock. Musculoskeletal: No acute or significant osseous findings. IMPRESSION: 1. Subcutaneous abscess at the junction of the right buttock and perineum measuring 2.9 x 7.4 x 4.5 cm with adjacent cellulitis. 2. Colonic diverticulosis without acute diverticulitis. 3. No acute solid organ pathology or adenopathy. Electronically Signed   By: Tollie Ethavid  Kwon M.D.   On: 11/23/2017 02:32    EKG: Independently reviewed.  Assessment/Plan Principal Problem:   Cellulitis and abscess of buttock Active Problems:   DM2 (diabetes mellitus, type 2) (HCC)    1. Cellulitis and abscess of buttock - 1. Gen surg to see in AM 2. Will leave on Vanc for now 3. NPO 2. DM2 - 1. Sensitive SSI q4h for now.  DVT prophylaxis: SCDs Code Status: Full Family Communication: No family in room Disposition Plan: Home, likely post-op tomorrow Consults called: Dr. Ezzard StandingNewman called by EDP Admission status: Place in obs   Hillary BowGARDNER, JARED M. DO [facility] Pager [phone]  If 7AM-7PM, please contact day team taking care of patient  [email or web address] Password [facility]  11/23/2017, 3:45 AM

## 2017-11-23 NOTE — ED Notes (Signed)
Pt transported to CT ?

## 2017-11-23 NOTE — Anesthesia Procedure Notes (Signed)
Procedure Name: LMA Insertion Date/Time: 11/23/2017 5:55 PM Performed by: Thornell MuleStubblefield, Adyline Huberty G, CRNA Pre-anesthesia Checklist: Patient identified, Emergency Drugs available, Suction available and Patient being monitored Patient Re-evaluated:Patient Re-evaluated prior to induction Oxygen Delivery Method: Circle system utilized Preoxygenation: Pre-oxygenation with 100% oxygen Induction Type: IV induction LMA: LMA inserted LMA Size: 5.0 Number of attempts: 1 Placement Confirmation: positive ETCO2 and breath sounds checked- equal and bilateral Tube secured with: Tape Dental Injury: Teeth and Oropharynx as per pre-operative assessment

## 2017-11-23 NOTE — Progress Notes (Signed)
Pharmacy Antibiotic Note  Rodney Delgado is a 32 y.o. male admitted on 11/23/2017 with skin abscess.  Pharmacy has been consulted for vancomycin dosing.  Plan: Vancomycin 1 Gm + 1500 mg = 2.5 Gm load then 1250 mg IV q8h for est AUC 366  Goal AUC = 400-500 F/u scr/cultures/levels  Height: 6\' 3"  (190.5 cm) Weight: (!) 347 lb 11.2 oz (157.7 kg) IBW/kg (Calculated) : 84.5  Temp (24hrs), Avg:99.1 F (37.3 C), Min:98.9 F (37.2 C), Max:99.3 F (37.4 C)  Recent Labs  Lab 11/23/17 0116 11/23/17 0125  WBC 11.9*  --   CREATININE  --  0.80    Estimated Creatinine Clearance: 219.3 mL/min (by C-G formula based on SCr of 0.8 mg/dL).    Allergies  Allergen Reactions  . Nsaids Anaphylaxis  . Penicillins Rash    Has patient had a PCN reaction causing immediate rash, facial/tongue/throat swelling, SOB or lightheadedness with hypotension: Yes Has patient had a PCN reaction causing severe rash involving mucus membranes or skin necrosis: No Has patient had a PCN reaction that required hospitalization: No Has patient had a PCN reaction occurring within the last 10 years: No If all of the above answers are "NO", then may proceed with Cephalosporin use.     Antimicrobials this admission: 3/19 vancomycin >>    >>   Dose adjustments this admission:   Microbiology results:  BCx:   UCx:    Sputum:    MRSA PCR:   Thank you for allowing pharmacy to be a part of this patient's care.  Lorenza EvangelistGreen, Trinka Keshishyan R 11/23/2017 3:22 AM

## 2017-11-23 NOTE — Transfer of Care (Signed)
Immediate Anesthesia Transfer of Care Note  Patient: Rodney Delgado  Procedure(s) Performed: IRRIGATION AND DEBRIDEMENTPERINEAL ABSCESS (N/A )  Patient Location: PACU  Anesthesia Type:General  Level of Consciousness: drowsy, patient cooperative and responds to stimulation  Airway & Oxygen Therapy: Patient Spontanous Breathing and Patient connected to face mask oxygen  Post-op Assessment: Report given to RN and Post -op Vital signs reviewed and stable  Post vital signs: Reviewed and stable  Last Vitals:  Vitals:   11/23/17 0900 11/23/17 1416  BP: 120/74 135/87  Pulse: (!) 102 88  Resp: 20 (!) 21  Temp: 36.8 C 37.2 C  SpO2: 96% 97%    Last Pain:  Vitals:   11/23/17 1416  TempSrc: Oral  PainSc:       Patients Stated Pain Goal: 2 (11/23/17 0900)  Complications: No apparent anesthesia complications

## 2017-11-23 NOTE — Progress Notes (Signed)
Rodney Delgado is a 32 y.o. male with medical history significant of DM2 not on meds for about a year now.  Patient presents to the ED with c/o abscess and cellulitis of his R buttock / perineal area.  Symptoms ongoing for past 4 days, constant, dull pain, worsening.  Gets occasional abscesses but usually able to draw them to a head and drain himself.  Not so this time.   11/23/2017: Patient seen and examined at his bedside.  His pain is well controlled on current pain management.  CT abdomen and pelvis with contrast revealed abscess in the right buttock surrounded by cellulitis.  General surgery consulted and plans for I&D today.  Continue IV antibiotics.  Cultures will be sent out.  Please refer to H&P dictated by Dr. Julian ReilGardner on 11/23/2017 for further details of the assessment and plan.

## 2017-11-23 NOTE — ED Provider Notes (Signed)
[facility]-EMERGENCY DEPT Provider Note   CSN: [id number] Arrival date & time: 11/22/17  2047     History   Chief Complaint Chief Complaint  Patient presents with  . Abscess    HPI Rodney Delgado is a 32 y.o. male.  The history is provided by the patient.  Abscess  Location:  Pelvis Pelvic abscess location:  Perianal, gluteal cleft and scrotum Abscess quality: fluctuance, induration, painful, redness and warmth   Red streaking: no   Duration:  4 days Progression:  Worsening Pain details:    Quality:  Dull   Severity:  Moderate   Timing:  Constant   Progression:  Worsening Chronicity:  New Context: diabetes   Relieved by:  Nothing Worsened by:  Nothing Ineffective treatments:  None tried Associated symptoms: no fever and no vomiting   Risk factors: no hx of MRSA     History reviewed. No pertinent past medical history.  There are no active problems to display for this patient.   No past surgical history on file.     Home Medications    Prior to Admission medications   Medication Sig Start Date End Date Taking? Authorizing Provider  terbinafine (LAMISIL) 250 MG tablet Take 1 tablet (250 mg total) by mouth daily. Patient not taking: Reported on 11/23/2017 11/18/15   Lenn Sink, DPM    Family History No family history on file.  Social History Social History   Tobacco Use  . Smoking status: Current Some Day Smoker  Substance Use Topics  . Alcohol use: Yes    Alcohol/week: 0.0 oz    Comment: social  . Drug use: No     Allergies   Nsaids and Penicillins   Review of Systems Review of Systems  Constitutional: Negative for fever.  Respiratory: Negative for shortness of breath.   Cardiovascular: Negative for chest pain.  Gastrointestinal: Negative for diarrhea and vomiting.  Skin: Positive for color change.  All other systems reviewed and are negative.    Physical Exam Updated Vital Signs BP 133/81   Pulse 96    Temp 98.9 F (37.2 C) (Oral)   Resp 18   Ht 6\' 3"  (1.905 m)   Wt (!) 157.7 kg (347 lb 11.2 oz)   SpO2 94%   BMI 43.46 kg/m   Physical Exam  Constitutional: He is oriented to person, place, and time. He appears well-developed and well-nourished. No distress.  HENT:  Head: Normocephalic and atraumatic.  Mouth/Throat: No oropharyngeal exudate.  Eyes: Conjunctivae are normal. Pupils are equal, round, and reactive to light.  Neck: Normal range of motion. Neck supple.  Cardiovascular: Normal rate, regular rhythm, normal heart sounds and intact distal pulses.  Pulmonary/Chest: Effort normal and breath sounds normal. No stridor. He has no wheezes. He has no rales.  Abdominal: Soft. Bowel sounds are normal. He exhibits no mass. There is no tenderness. There is no rebound and no guarding.  Musculoskeletal: Normal range of motion.  Neurological: He is alert and oriented to person, place, and time.  Skin: Capillary refill takes less than 2 seconds. There is erythema.        ED Treatments / Results  Labs (all labs ordered are listed, but only abnormal results are displayed)  Results for orders placed or performed during the hospital encounter of 11/23/17  CBC with Differential/Platelet  Result Value Ref Range   WBC 11.9 (H) 4.0 - 10.5 K/uL   RBC 5.02 4.22 - 5.81 MIL/uL   Hemoglobin 14.3 13.0 -  17.0 g/dL   HCT 72.5 36.6 - 44.0 %   MCV 84.3 78.0 - 100.0 fL   MCH 28.5 26.0 - 34.0 pg   MCHC 33.8 30.0 - 36.0 g/dL   RDW 34.7 42.5 - 95.6 %   Platelets 276 150 - 400 K/uL   Neutrophils Relative % 58 %   Neutro Abs 6.8 1.7 - 7.7 K/uL   Lymphocytes Relative 30 %   Lymphs Abs 3.6 0.7 - 4.0 K/uL   Monocytes Relative 9 %   Monocytes Absolute 1.1 (H) 0.1 - 1.0 K/uL   Eosinophils Relative 3 %   Eosinophils Absolute 0.3 0.0 - 0.7 K/uL   Basophils Relative 0 %   Basophils Absolute 0.0 0.0 - 0.1 K/uL  I-Stat Chem 8, ED  Result Value Ref Range   Sodium 141 135 - 145 mmol/L   Potassium 3.6 3.5  - 5.1 mmol/L   Chloride 100 (L) 101 - 111 mmol/L   BUN 9 6 - 20 mg/dL   Creatinine, Ser 3.87 0.61 - 1.24 mg/dL   Glucose, Bld 564 (H) 65 - 99 mg/dL   Calcium, Ion 3.32 9.51 - 1.40 mmol/L   TCO2 29 22 - 32 mmol/L   Hemoglobin 15.6 13.0 - 17.0 g/dL   HCT 88.4 16.6 - 06.3 %  POC CBG, ED  Result Value Ref Range   Glucose-Capillary 157 (H) 65 - 99 mg/dL   Ct Abdomen Pelvis W Contrast  Result Date: 11/23/2017 CLINICAL DATA:  Right buttock abscess for 4 days. EXAM: CT ABDOMEN AND PELVIS WITH CONTRAST TECHNIQUE: Multidetector CT imaging of the abdomen and pelvis was performed using the standard protocol following bolus administration of intravenous contrast. CONTRAST:  100 cc ISOVUE-300 IOPAMIDOL (ISOVUE-300) INJECTION 61% COMPARISON:  None. FINDINGS: Lower chest: Normal heart size without pericardial effusion. Clear lung bases. Hepatobiliary: Homogeneous appearance of the liver without space-occupying mass. Normal gallbladder. No biliary dilatation. Pancreas: Normal Spleen: Normal Adrenals/Urinary Tract: Normal Stomach/Bowel: Colonic diverticulosis along the sigmoid colon without acute diverticulitis. Normal appendix. Unremarkable stomach and small intestine. Vascular/Lymphatic: Normal caliber aorta and iliac arteries. No pelvic sidewall lymphadenopathy or inguinal adenopathy. Reproductive: Unremarkable Other: Subcutaneous abscess at the junction of the right buttock and perineum measuring 2.9 x 7.4 x 4.5 cm with cellulitic subcutaneous soft tissue induration involving the adjacent buttock. Musculoskeletal: No acute or significant osseous findings. IMPRESSION: 1. Subcutaneous abscess at the junction of the right buttock and perineum measuring 2.9 x 7.4 x 4.5 cm with adjacent cellulitis. 2. Colonic diverticulosis without acute diverticulitis. 3. No acute solid organ pathology or adenopathy. Electronically Signed   By: Tollie Eth M.D.   On: 11/23/2017 02:32    Radiology Ct Abdomen Pelvis W  Contrast  Result Date: 11/23/2017 CLINICAL DATA:  Right buttock abscess for 4 days. EXAM: CT ABDOMEN AND PELVIS WITH CONTRAST TECHNIQUE: Multidetector CT imaging of the abdomen and pelvis was performed using the standard protocol following bolus administration of intravenous contrast. CONTRAST:  100 cc ISOVUE-300 IOPAMIDOL (ISOVUE-300) INJECTION 61% COMPARISON:  None. FINDINGS: Lower chest: Normal heart size without pericardial effusion. Clear lung bases. Hepatobiliary: Homogeneous appearance of the liver without space-occupying mass. Normal gallbladder. No biliary dilatation. Pancreas: Normal Spleen: Normal Adrenals/Urinary Tract: Normal Stomach/Bowel: Colonic diverticulosis along the sigmoid colon without acute diverticulitis. Normal appendix. Unremarkable stomach and small intestine. Vascular/Lymphatic: Normal caliber aorta and iliac arteries. No pelvic sidewall lymphadenopathy or inguinal adenopathy. Reproductive: Unremarkable Other: Subcutaneous abscess at the junction of the right buttock and perineum measuring 2.9 x 7.4 x  4.5 cm with cellulitic subcutaneous soft tissue induration involving the adjacent buttock. Musculoskeletal: No acute or significant osseous findings. IMPRESSION: 1. Subcutaneous abscess at the junction of the right buttock and perineum measuring 2.9 x 7.4 x 4.5 cm with adjacent cellulitis. 2. Colonic diverticulosis without acute diverticulitis. 3. No acute solid organ pathology or adenopathy. Electronically Signed   By: Tollie Ethavid  Kwon M.D.   On: 11/23/2017 02:32    Procedures Procedures (including critical care time)  Medications Ordered in ED Medications  levofloxacin (LEVAQUIN) IVPB 750 mg (750 mg Intravenous New Bag/Given 11/23/17 0254)  0.9 %  sodium chloride infusion ( Intravenous New Bag/Given 11/23/17 0303)  vancomycin (VANCOCIN) IVPB 1000 mg/200 mL premix (0 mg Intravenous Stopped 11/23/17 0301)  iopamidol (ISOVUE-300) 61 % injection 100 mL (100 mLs Intravenous Contrast Given  11/23/17 0143)      Final Clinical Impressions(s) / ED Diagnoses   Final diagnoses:  Cellulitis and abscess of buttock   Case d/w Dr. Ezzard StandingNewman who will see the patient in consult  Case d/w Dr. Julian ReilGardner who will admit the patient   Anjulie Dipierro, MD 11/23/17 [phone]

## 2017-11-23 NOTE — Consult Note (Addendum)
[facility] Surgery Consult Note  Rodney Delgado Jul 31, 1986  [id number].    Requesting MD: Margo Aye, MD Chief Complaint/Reason for Consult: buttock/perineal abscess  HPI:  32 y/o AA male with Hx of obesity and diabetes who presented with painful abscess of the right buttock/groin. Reports the pain started a 3-4 days ago but became unbearable yesterday. Non-radiating. Denies drainage from the area. Denies aggravating or alleviating factors. Reports history of a boil on his thigh that resolved at home.  When asked about his diabetes he states that his PCP told him he was pre-diabetic but never prescribed medication because he was loosing weight. Reports 70 lbs of weight loss 2/2 exercise. Reports drinking alcohol every other weekend. Denies cigarette smoking, states he used to smoke cigars occasionally. Denies illicit drugs. Works as a Education administrator.  Today he tells me that his throat feels like it is closing when he takes tylenol.   ROS: Review of Systems  Constitutional: Positive for weight loss (intentional). Negative for chills and fever.  All other systems reviewed and are negative.   History reviewed. No pertinent family history.  Past Medical History:  Diagnosis Date  . DM2 (diabetes mellitus, type 2) (HCC)   . Morbid obesity with BMI of 40.0-44.9, adult [facility])     History reviewed. No pertinent surgical history.  Social History:  reports that he has been smoking.  he has never used smokeless tobacco. He reports that he drinks alcohol. He reports that he does not use drugs.  Allergies:  Allergies  Allergen Reactions  . Nsaids Anaphylaxis  . Penicillins Rash    Has patient had a PCN reaction causing immediate rash, facial/tongue/throat swelling, SOB or lightheadedness with hypotension: Yes Has patient had a PCN reaction causing severe rash involving mucus membranes or skin necrosis: No Has patient had a PCN reaction that required hospitalization: No Has patient had a PCN reaction  occurring within the last 10 years: No If all of the above answers are "NO", then may proceed with Cephalosporin use.     Medications Prior to Admission  Medication Sig Dispense Refill  . terbinafine (LAMISIL) 250 MG tablet Take 1 tablet (250 mg total) by mouth daily. (Patient not taking: Reported on 11/23/2017) 90 tablet 0    Blood pressure 119/64, pulse 100, temperature 99.1 F (37.3 C), temperature source Oral, resp. rate 18, height 6\' 3"  (1.905 m), weight (!) 157.7 kg (347 lb 11.2 oz), SpO2 99 %. Physical Exam: Physical Exam  Constitutional: He is oriented to person, place, and time. He appears well-developed and well-nourished. No distress.  HENT:  Head: Normocephalic and atraumatic.  Right Ear: External ear normal.  Left Ear: External ear normal.  Nose: Nose normal.  Eyes: EOM are normal. Right eye exhibits no discharge. Left eye exhibits no discharge. No scleral icterus.  Neck: Normal range of motion. Neck supple. No JVD present. No tracheal deviation present.  Cardiovascular: Normal rate, regular rhythm, normal heart sounds and intact distal pulses.  Pulmonary/Chest: Effort normal and breath sounds normal. No stridor. No respiratory distress.  Abdominal: Soft. Bowel sounds are normal. He exhibits no distension. There is no tenderness. There is no guarding.  Genitourinary: Penis normal.  Genitourinary Comments: abcess of right perineal area - roughly 2 x 8 cm area of fluctuance in right perineal area near gluteal fold with surrounding induration that extends to the right buttock and medial thigh. No active drainage. Tender to palpation.  Musculoskeletal: Normal range of motion. He exhibits no edema or deformity.  Neurological: He  is alert and oriented to person, place, and time. No cranial nerve deficit.  Skin: Skin is warm and dry. No rash noted. He is not diaphoretic.  Psychiatric: He has a normal mood and affect. His behavior is normal.    Results for orders placed or  performed during the hospital encounter of 11/23/17 (from the past 48 hour(s))  CBC with Differential/Platelet     Status: Abnormal   Collection Time: 11/23/17  1:16 AM  Result Value Ref Range   WBC 11.9 (H) 4.0 - 10.5 K/uL   RBC 5.02 4.22 - 5.81 MIL/uL   Hemoglobin 14.3 13.0 - 17.0 g/dL   HCT 16.1 09.6 - 04.5 %   MCV 84.3 78.0 - 100.0 fL   MCH 28.5 26.0 - 34.0 pg   MCHC 33.8 30.0 - 36.0 g/dL   RDW 40.9 81.1 - 91.4 %   Platelets 276 150 - 400 K/uL   Neutrophils Relative % 58 %   Neutro Abs 6.8 1.7 - 7.7 K/uL   Lymphocytes Relative 30 %   Lymphs Abs 3.6 0.7 - 4.0 K/uL   Monocytes Relative 9 %   Monocytes Absolute 1.1 (H) 0.1 - 1.0 K/uL   Eosinophils Relative 3 %   Eosinophils Absolute 0.3 0.0 - 0.7 K/uL   Basophils Relative 0 %   Basophils Absolute 0.0 0.0 - 0.1 K/uL    Comment: Performed at [facility], [address], [place], [place] [address]  POC CBG, ED     Status: Abnormal   Collection Time: 11/23/17  1:17 AM  Result Value Ref Range   Glucose-Capillary 157 (H) 65 - 99 mg/dL  I-Stat Chem 8, ED     Status: Abnormal   Collection Time: 11/23/17  1:25 AM  Result Value Ref Range   Sodium 141 135 - 145 mmol/L   Potassium 3.6 3.5 - 5.1 mmol/L   Chloride 100 (L) 101 - 111 mmol/L   BUN 9 6 - 20 mg/dL   Creatinine, Ser 6.21 0.61 - 1.24 mg/dL   Glucose, Bld 308 (H) 65 - 99 mg/dL   Calcium, Ion 6.57 8.46 - 1.40 mmol/L   TCO2 29 22 - 32 mmol/L   Hemoglobin 15.6 13.0 - 17.0 g/dL   HCT 96.2 95.2 - 84.1 %  CBG monitoring, ED     Status: Abnormal   Collection Time: 11/23/17  3:18 AM  Result Value Ref Range   Glucose-Capillary 180 (H) 65 - 99 mg/dL  CBG monitoring, ED     Status: Abnormal   Collection Time: 11/23/17  4:09 AM  Result Value Ref Range   Glucose-Capillary 171 (H) 65 - 99 mg/dL   Comment 1 Notify RN   Glucose, capillary     Status: Abnormal   Collection Time: 11/23/17  5:55 AM  Result Value Ref Range   Glucose-Capillary 159 (H) 65 - 99 mg/dL   Glucose, capillary     Status: Abnormal   Collection Time: 11/23/17  7:52 AM  Result Value Ref Range   Glucose-Capillary 183 (H) 65 - 99 mg/dL   Ct Abdomen Pelvis W Contrast  Result Date: 11/23/2017 CLINICAL DATA:  Right buttock abscess for 4 days. EXAM: CT ABDOMEN AND PELVIS WITH CONTRAST TECHNIQUE: Multidetector CT imaging of the abdomen and pelvis was performed using the standard protocol following bolus administration of intravenous contrast. CONTRAST:  100 cc ISOVUE-300 IOPAMIDOL (ISOVUE-300) INJECTION 61% COMPARISON:  None. FINDINGS: Lower chest: Normal heart size without pericardial effusion. Clear lung bases. Hepatobiliary: Homogeneous  appearance of the liver without space-occupying mass. Normal gallbladder. No biliary dilatation. Pancreas: Normal Spleen: Normal Adrenals/Urinary Tract: Normal Stomach/Bowel: Colonic diverticulosis along the sigmoid colon without acute diverticulitis. Normal appendix. Unremarkable stomach and small intestine. Vascular/Lymphatic: Normal caliber aorta and iliac arteries. No pelvic sidewall lymphadenopathy or inguinal adenopathy. Reproductive: Unremarkable Other: Subcutaneous abscess at the junction of the right buttock and perineum measuring 2.9 x 7.4 x 4.5 cm with cellulitic subcutaneous soft tissue induration involving the adjacent buttock. Musculoskeletal: No acute or significant osseous findings. IMPRESSION: 1. Subcutaneous abscess at the junction of the right buttock and perineum measuring 2.9 x 7.4 x 4.5 cm with adjacent cellulitis. 2. Colonic diverticulosis without acute diverticulitis. 3. No acute solid organ pathology or adenopathy. Electronically Signed   By: Tollie Ethavid  Kwon M.D.   On: 11/23/2017 02:32      Assessment/Plan Right perineal abscess  - IV abx  - NPO  - OR today for incision and drainage, cultures  - [facility]   Diabetes Mellitus - ordered Hgb A1c; consult to dietitian to discuss appropriate diabetic diet.   Obesity    Adam PhenixElizabeth S Melvin Whiteford, [facility] LlLPA-C [facility] 11/23/2017, 8:04 AM Pager: [phone] Consults: [phone] Mon-Fri 7:00 am-4:30 pm Sat-Sun 7:00 am-11:30 am

## 2017-11-23 NOTE — Progress Notes (Signed)
Initial Nutrition Assessment  DOCUMENTATION CODES:   Morbid obesity  INTERVENTION:    Juven Fruit Punch BID, each serving provides 80kcal and 14g of protein (amino acids glutamine and arginine)  Provide Premier Protein once daily, each supplement provides 160kcal and 30g protein.   Provide MVI daily  NUTRITION DIAGNOSIS:   Increased nutrient needs related to wound healing as evidenced by estimated needs.  GOAL:   Patient will meet greater than or equal to 90% of their needs   MONITOR:   PO intake, Supplement acceptance, Diet advancement, Weight trends, Labs  REASON FOR ASSESSMENT:   Consult Diet education  ASSESSMENT:   Patient with PMH significant for DM. Presents this admission with abscess and cellulitis of his right buttocks and perineal area.  Plan for incision and drainage 3/19.    Spoke with pt at bedside. Denies any loss of appetite PTA consuming smaller meals throughout the day. Pt endorses a 70 lb intentional weight loss from his UBW of 400 lb over the last year. States he did not change much about his diet, but his activity level increased significantly at work. RD was consulted for diabetes education. Pt's A1C noted to be 7.5 this hospital stay.   Discussed different food groups and their effects on blood sugar, emphasizing carbohydrate-containing foods. Provided list of carbohydrates and recommended serving sizes of common foods. Discussed importance of controlled and consistent carbohydrate intake throughout the day. Provided examples of ways to balance meals/snacks and encouraged intake of high-fiber, whole grain complex carbohydrates.   Pt admits he eats excessive amounts of sunflower seeds at work and mostly eats fast food during his lunch period. Discussed ways to make snacks and meals balanced at work. Encouraged pt to decrease sugar sweetened beverages and offered alternatives.   RD provided "Carbohydrate Counting for People with Diabetes" handout from  the Academy of Nutrition and Dietetics.   Nutrition-Focused physical exam completed.   Medications reviewed and include: SSI, IV abx, NS @ 125 ml/hr Labs reviewed.   NUTRITION - FOCUSED PHYSICAL EXAM:    Most Recent Value  Orbital Region  No depletion  Upper Arm Region  No depletion  Thoracic and Lumbar Region  No depletion  Buccal Region  No depletion  Temple Region  No depletion  Clavicle Bone Region  No depletion  Clavicle and Acromion Bone Region  No depletion  Scapular Bone Region  No depletion  Dorsal Hand  No depletion  Patellar Region  No depletion  Anterior Thigh Region  No depletion  Posterior Calf Region  No depletion  Edema (RD Assessment)  None  Hair  Reviewed  Eyes  Reviewed  Mouth  Reviewed  Skin  Reviewed  Nails  Reviewed     Diet Order:  Diet NPO time specified Except for: Sips with Meds  EDUCATION NEEDS:   Education needs have been addressed  Skin:  Skin Assessment: Skin Integrity Issues: Skin Integrity Issues:: Other (Comment) Other: abscess right buttocks  Last BM:  11/22/17  Height:   Ht Readings from Last 1 Encounters:  11/22/17 6\' 3"  (1.905 m)    Weight:   Wt Readings from Last 1 Encounters:  11/22/17 (!) 347 lb 11.2 oz (157.7 kg)    Ideal Body Weight:  89.1 kg  BMI:  Body mass index is 43.46 kg/m.  Estimated Nutritional Needs:   Kcal:  2400-2600 kcal/day  Protein:  120-130 g/day  Fluid:  >2.4 L/day    Vanessa Kickarly Kenard Morawski RD, LDN Clinical Nutrition Pager # - [phone]

## 2017-11-24 ENCOUNTER — Encounter (HOSPITAL_COMMUNITY): Payer: Self-pay | Admitting: General Surgery

## 2017-11-24 DIAGNOSIS — Z794 Long term (current) use of insulin: Secondary | ICD-10-CM | POA: Diagnosis not present

## 2017-11-24 DIAGNOSIS — L0231 Cutaneous abscess of buttock: Secondary | ICD-10-CM | POA: Diagnosis present

## 2017-11-24 DIAGNOSIS — L03317 Cellulitis of buttock: Secondary | ICD-10-CM | POA: Diagnosis present

## 2017-11-24 DIAGNOSIS — Z6841 Body Mass Index (BMI) 40.0 and over, adult: Secondary | ICD-10-CM | POA: Diagnosis not present

## 2017-11-24 DIAGNOSIS — L02215 Cutaneous abscess of perineum: Secondary | ICD-10-CM | POA: Diagnosis present

## 2017-11-24 DIAGNOSIS — Z87892 Personal history of anaphylaxis: Secondary | ICD-10-CM | POA: Diagnosis not present

## 2017-11-24 DIAGNOSIS — E1165 Type 2 diabetes mellitus with hyperglycemia: Secondary | ICD-10-CM | POA: Diagnosis present

## 2017-11-24 DIAGNOSIS — Z880 Allergy status to penicillin: Secondary | ICD-10-CM | POA: Diagnosis not present

## 2017-11-24 DIAGNOSIS — Z888 Allergy status to other drugs, medicaments and biological substances status: Secondary | ICD-10-CM | POA: Diagnosis not present

## 2017-11-24 DIAGNOSIS — F17200 Nicotine dependence, unspecified, uncomplicated: Secondary | ICD-10-CM | POA: Diagnosis present

## 2017-11-24 LAB — BASIC METABOLIC PANEL
Anion gap: 10 (ref 5–15)
BUN: 11 mg/dL (ref 6–20)
CO2: 27 mmol/L (ref 22–32)
Calcium: 9 mg/dL (ref 8.9–10.3)
Chloride: 102 mmol/L (ref 101–111)
Creatinine, Ser: 0.76 mg/dL (ref 0.61–1.24)
GFR calc Af Amer: >60 mL/min (ref >60)
GLUCOSE: 189 mg/dL — AB (ref 65–99)
POTASSIUM: 4.2 mmol/L (ref 3.5–5.1)
Sodium: 139 mmol/L (ref 135–145)

## 2017-11-24 LAB — CBC
HCT: 42 % (ref 39.0–52.0)
Hemoglobin: 13.8 g/dL (ref 13.0–17.0)
MCH: 27.8 pg (ref 26.0–34.0)
MCHC: 32.9 g/dL (ref 30.0–36.0)
MCV: 84.7 fL (ref 78.0–100.0)
PLATELETS: 309 10*3/uL (ref 150–400)
RBC: 4.96 MIL/uL (ref 4.22–5.81)
RDW: 12.4 % (ref 11.5–15.5)
WBC: 9.8 10*3/uL (ref 4.0–10.5)

## 2017-11-24 LAB — GLUCOSE, CAPILLARY
GLUCOSE-CAPILLARY: 146 mg/dL — AB (ref 65–99)
GLUCOSE-CAPILLARY: 153 mg/dL — AB (ref 65–99)
GLUCOSE-CAPILLARY: 197 mg/dL — AB (ref 65–99)
Glucose-Capillary: 184 mg/dL — ABNORMAL HIGH (ref 65–99)
Glucose-Capillary: 173 mg/dL — ABNORMAL HIGH (ref 65–99)
Glucose-Capillary: 170 mg/dL — ABNORMAL HIGH (ref 65–99)
Glucose-Capillary: 193 mg/dL — ABNORMAL HIGH (ref 65–99)

## 2017-11-24 MED ORDER — VANCOMYCIN HCL 10 G IV SOLR
1250.0000 mg | Freq: Three times a day (TID) | INTRAVENOUS | Status: DC
  Administered 2017-11-25 (×2): 1250 mg via INTRAVENOUS
  Filled 2017-11-24 (×3): qty 1250

## 2017-11-24 MED ORDER — IBUPROFEN 200 MG PO TABS
400.0000 mg | ORAL_TABLET | ORAL | Status: DC
  Administered 2017-11-24 – 2017-11-25 (×7): 400 mg via ORAL
  Filled 2017-11-24 (×7): qty 2

## 2017-11-24 NOTE — Progress Notes (Addendum)
Inpatient Diabetes Program Recommendations  AACE/ADA: New Consensus Statement on Inpatient Glycemic Control (2015)  Target Ranges:  Prepandial:   less than 140 mg/dL      Peak postprandial:   less than 180 mg/dL (1-2 hours)      Critically ill patients:  140 - 180 mg/dL   Results for Rodney, Delgado (MRN [id number]) as of 11/24/2017 09:07  Ref. Range 11/23/2017 01:17 11/23/2017 03:18 11/23/2017 04:09 11/23/2017 05:55 11/23/2017 07:52 11/23/2017 11:55 11/23/2017 16:30 11/23/2017 18:26 11/23/2017 20:04  Glucose-Capillary Latest Ref Range: 65 - 99 mg/dL 409 (H) 811 (H) 914 (H) 159 (H) 183 (H) 144 (H) 124 (H) 110 (H) 179 (H)   Results for Rodney, SAPIA (MRN [id number]) as of 11/24/2017 09:07  Ref. Range 11/24/2017 00:10 11/24/2017 03:45 11/24/2017 07:51  Glucose-Capillary Latest Ref Range: 65 - 99 mg/dL 086 (H) 578 (H) 469 (H)   Results for Rodney, Delgado (MRN [id number]) as of 11/24/2017 09:07  Ref. Range 11/23/2017 06:57  Hemoglobin A1C Latest Ref Range: 4.8 - 5.6 % 7.5 (H)    Admit with: Abscess and cellulitis of buttocks  History: DM2  Home DM Meds: None- Off Meds for 1 year per MD notes  Current Insulin Orders: Novolog Sensitive Correction Scale/ SSI (0-9 units) Q4 hours     PCP: Rada Hay [facility]  Last saw PCP on 02/05/2017.  At that visit, pt decided to try nutritional changes and exercise to try to control CBGs.  Deferred initiation of Metformin.      MD- Note CBGs well controlled here in hospital.  Overall, Hemoglobin A1c fairly controlled (7.5%).  May want to consider having patient start Metformin 500 mg BID at time of discharge to help further reduce glucose levels at home and to promote better healing through tighter blood sugar control.  Needs follow-up with PCP.    Addendum 1pm- Spoke with patient earlier this AM.  Discussed with pt current A1c level, current CBGs, current medication regimen.  Explained to pt that we are  managing pt's CBGs with insulin in hospital in order to maintain good control to promote healing.  Patient agreeable to current regimen.  Pt stated he has been trying to lose weight and stay active.  Did not want to statr Metformin last year at his PCP visit.  Has glucometer and supplies at home and stated his Mom makes him check his CBGs every now and then (not checking regularly).  Encouraged pt to check his CBGs at least once daily at home after discharge from the hospital.  Encouraged variety of times for CBGs each week (either 1st thing in AM, before meals, or 2-hours after meals).  Reviewed CBG goals for home.  Explained to pt that's it's important to check CBGs especially after having an infection to make sure the CBGs are staying in the healthy range to prevent further infection and promote healing.    Discussed the possibility of starting medication for home (like Metformin) to help better manage CBGs post-op.  Explained what Metformin is and how it works, when to take, side effects, etc.  Pt agreeable to take Metformin if ordered by the MD at time of d/c.  Encouraged pt to make a follow-up appt with PCP within 1-2 weeks of d/c.    Patient stated he needs to do better with elimination sugary drinks from his diet.  Currently drinking regular lemonade, regular soda, and sweet tea.  Stated he knows he needs to stop drinking these drinks and  just switch to water.       --Will follow patient during hospitalization--  Ambrose FinlandJeannine Johnston Gladis Soley RN, MSN, CDE Diabetes Coordinator Inpatient Glycemic Control Team Team Pager: [phone] (8a-5p)

## 2017-11-24 NOTE — Progress Notes (Signed)
PROGRESS NOTE    Rodney Delgado  ZOX:[id number] DOB: 09-23-1985 DOA: 11/23/2017 PCP: Patient, No Pcp Per   Brief Narrative: Patient is a 32 year old male with past medical history significant for diabetes type 2 not on meds who presented to the emergency room with complaint of pain, swelling of the right  buttock and perianal area.  Found to have abscess and cellulitis of his right buttock surgery is following and he underwent   Incision and drainage of abscess.   Assessment & Plan:   Principal Problem:   Cellulitis and abscess of buttock Active Problems:   DM2 (diabetes mellitus, type 2) (HCC)  Abscess and cellulitis of the right buttock and perineal region: Underwent irrigation and debridement of perineal abscess on 11/23/17.  General surgery following.  Continue antibiotics.  Anaerobic cultures showed mixed organism.  MRSA PCR positive  Patient is afebrile.  No leukocytosis. Needs wound care on discharge.  Continue pain management.  DM  type II: Continue sliding scale insulin now.  He will be discharged on metformin   DVT prophylaxis:SCD Code Status: Full Family Communication: None present at the bedside Disposition Plan: Home in 1-2 days   Consultants: General surgery  Procedures: Irrigation and debridement of the perineal abscess  Antimicrobials: Vancomycin since 11/23/17  Subjective: Patient seen and examined at bedside this morning.  Remains comfortable.  Complains of some pain on his right buttock and right perineal area Objective: Vitals:   11/23/17 2215 11/24/17 0129 11/24/17 0537 11/24/17 1000  BP: 120/64 (!) 107/57 122/62 126/70  Pulse: 98 77 81 78  Resp: 16 16 16 18   Temp: 98.2 F (36.8 C) 97.6 F (36.4 C) 97.8 F (36.6 C) 98 F (36.7 C)  TempSrc: Oral Oral Oral Oral  SpO2: 100% 98% 99% 100%  Weight:      Height:        Intake/Output Summary (Last 24 hours) at 11/24/2017 1400 Last data filed at 11/24/2017 1000 Gross per 24 hour  Intake 3376.91 ml    Output 2180 ml  Net 1196.91 ml   Filed Weights   11/22/17 2050  Weight: (!) 157.7 kg (347 lb 11.2 oz)    Examination:  General exam: Appears calm and comfortable ,Not in distress, morbidly obese HEENT:PERRL,Oral mucosa moist, Ear/Nose normal on gross exam Respiratory system: Bilateral equal air entry, normal vesicular breath sounds, no wheezes or crackles  Cardiovascular system: S1 & S2 heard, RRR. No JVD, murmurs, rubs, gallops or clicks. No pedal edema. Gastrointestinal system: Abdomen is nondistended, soft and nontender. No organomegaly or masses felt. Normal bowel sounds heard. Central nervous system: Alert and oriented. No focal neurological deficits. Extremities: No edema, no clubbing ,no cyanosis, distal peripheral pulses palpable. Skin: No rashes, lesions or ulcers,no icterus ,no pallor Clean surgical wound on his right buttock draining sanguinous fluid, indurated and tender right perineal region MSK: Normal muscle bulk,tone ,power Psychiatry: Judgement and insight appear normal. Mood & affect appropriate.     Data Reviewed: I have personally reviewed following labs and imaging studies  CBC: Recent Labs  Lab 11/23/17 0116 11/23/17 0125 11/24/17 0512  WBC 11.9*  --  9.8  NEUTROABS 6.8  --   --   HGB 14.3 15.6 13.8  HCT 42.3 46.0 42.0  MCV 84.3  --  84.7  PLT 276  --  309   Basic Metabolic Panel: Recent Labs  Lab 11/23/17 0125 11/24/17 0512  NA 141 139  K 3.6 4.2  CL 100* 102  CO2  --  27  GLUCOSE 152* 189*  BUN 9 11  CREATININE 0.80 0.76  CALCIUM  --  9.0   GFR: Estimated Creatinine Clearance: 219.3 mL/min (by C-G formula based on SCr of 0.76 mg/dL). Liver Function Tests: No results for input(s): AST, ALT, ALKPHOS, BILITOT, PROT, ALBUMIN in the last 168 hours. No results for input(s): LIPASE, AMYLASE in the last 168 hours. No results for input(s): AMMONIA in the last 168 hours. Coagulation Profile: No results for input(s): INR, PROTIME in the last  168 hours. Cardiac Enzymes: No results for input(s): CKTOTAL, CKMB, CKMBINDEX, TROPONINI in the last 168 hours. BNP (last 3 results) No results for input(s): PROBNP in the last 8760 hours. HbA1C: Recent Labs    11/23/17 0657  HGBA1C 7.5*   CBG: Recent Labs  Lab 11/23/17 2004 11/24/17 0010 11/24/17 0345 11/24/17 0751 11/24/17 1159  GLUCAP 179* 197* 184* 153* 173*   Lipid Profile: No results for input(s): CHOL, HDL, LDLCALC, TRIG, CHOLHDL, LDLDIRECT in the last 72 hours. Thyroid Function Tests: No results for input(s): TSH, T4TOTAL, FREET4, T3FREE, THYROIDAB in the last 72 hours. Anemia Panel: No results for input(s): VITAMINB12, FOLATE, FERRITIN, TIBC, IRON, RETICCTPCT in the last 72 hours. Sepsis Labs: No results for input(s): PROCALCITON, LATICACIDVEN in the last 168 hours.  Recent Results (from the past 240 hour(s))  Surgical pcr screen     Status: Abnormal   Collection Time: 11/23/17  8:22 AM  Result Value Ref Range Status   MRSA, PCR NEGATIVE NEGATIVE Final   Staphylococcus aureus POSITIVE (A) NEGATIVE Final    Comment: (NOTE) The Xpert SA Assay (FDA approved for NASAL specimens in patients 32 years of age and older), is one component of a comprehensive surveillance program. It is not intended to diagnose infection nor to guide or monitor treatment. Performed at [facility], [address] [place], [place] [address]   Anaerobic culture     Status: None (Preliminary result)   Collection Time: 11/23/17  6:02 PM  Result Value Ref Range Status   Specimen Description   Final    ABSCESS RIGHT BUTTOCKS Performed at [facility], [address] [place], [place] [address]    Special Requests   Final    PATIENT ON FOLLOWING VANCOMYCIN Performed at [facility], [address], [place], [place] [address]    Gram Stain   Final    ABUNDANT WBC PRESENT, PREDOMINANTLY PMN MODERATE GRAM POSITIVE COCCI RARE GRAM  NEGATIVE RODS RARE GRAM POSITIVE RODS Performed at [facility], [address] [place], [place] [address]    Culture PENDING  Incomplete   Report Status PENDING  Incomplete  Aerobic Culture (superficial specimen)     Status: None (Preliminary result)   Collection Time: 11/23/17  6:02 PM  Result Value Ref Range Status   Specimen Description   Final    ABSCESS RIGHT BUTTOCKS Performed at [facility], [address] [place], [place] [address]    Special Requests   Final    PATIENT ON FOLLOWING VANCOMYCIN Performed at [facility], [address], [place], [place] [address]    Gram Stain PENDING  Incomplete   Culture   Final    NO GROWTH < 24 HOURS Performed at [facility], [address] [place], [place] [address]    Report Status PENDING  Incomplete         Radiology Studies: Ct Abdomen Pelvis W Contrast  Result Date: 11/23/2017 CLINICAL  DATA:  Right buttock abscess for 4 days. EXAM: CT ABDOMEN AND PELVIS WITH CONTRAST TECHNIQUE: Multidetector CT imaging of the abdomen and pelvis was performed using the standard protocol following bolus administration of intravenous contrast. CONTRAST:  100 cc ISOVUE-300 IOPAMIDOL (ISOVUE-300) INJECTION 61% COMPARISON:  None. FINDINGS: Lower chest: Normal heart size without pericardial effusion. Clear lung bases. Hepatobiliary: Homogeneous appearance of the liver without space-occupying mass. Normal gallbladder. No biliary dilatation. Pancreas: Normal Spleen: Normal Adrenals/Urinary Tract: Normal Stomach/Bowel: Colonic diverticulosis along the sigmoid colon without acute diverticulitis. Normal appendix. Unremarkable stomach and small intestine. Vascular/Lymphatic: Normal caliber aorta and iliac arteries. No pelvic sidewall lymphadenopathy or inguinal adenopathy. Reproductive: Unremarkable Other: Subcutaneous abscess at the junction of the right buttock and perineum measuring 2.9 x 7.4 x 4.5 cm  with cellulitic subcutaneous soft tissue induration involving the adjacent buttock. Musculoskeletal: No acute or significant osseous findings. IMPRESSION: 1. Subcutaneous abscess at the junction of the right buttock and perineum measuring 2.9 x 7.4 x 4.5 cm with adjacent cellulitis. 2. Colonic diverticulosis without acute diverticulitis. 3. No acute solid organ pathology or adenopathy. Electronically Signed   By: Tollie Ethavid  Kwon M.D.   On: 11/23/2017 02:32        Scheduled Meds: . Chlorhexidine Gluconate Cloth  6 each Topical Daily  . ibuprofen  400 mg Oral Q4H  . insulin aspart  0-9 Units Subcutaneous Q4H  . multivitamin with minerals  1 tablet Oral Daily  . mupirocin ointment  1 application Nasal BID  . nutrition supplement (JUVEN)  1 packet Oral BID BM  . protein supplement shake  11 oz Oral Q24H   Continuous Infusions: . sodium chloride 50 mL/hr at 11/24/17 1000  . vancomycin 1,250 mg (11/24/17 0529)     LOS: 0 days    Time spent: More than 50% of that time was spent in counseling and/or coordination of care.      Burnadette PopAmrit Khalfani Weideman, MD [facility] Pager [phone]  If 7PM-7AM, please contact night-coverage [email or web address] Password [email or web address] 11/24/2017, 2:00 PM

## 2017-11-24 NOTE — Progress Notes (Signed)
[facility] Progress Note  1 Day Post-Op  Subjective: CC: currently sitting up in bed eating breakfast. Denies nausea or vomiting after surgery. Reports mild pain in right perineum/buttock. Reports taking ibuprofen recently without any issues, states it is tylenol that makes his throat swell. Has not been out of bed since surgery but plans to get up and walk shortly.States his mother will be able to help him with wound care at home.            Objective: Vital signs in last 24 hours: Temp:  [97.6 F (36.4 C)-99.1 F (37.3 C)] 97.8 F (36.6 C) (03/20 0537) Pulse Rate:  [77-102] 81 (03/20 0537) Resp:  [12-21] 16 (03/20 0537) BP: (107-155)/(57-98) 122/62 (03/20 0537) SpO2:  [94 %-100 %] 99 % (03/20 0537) Last BM Date: 11/22/17  Intake/Output from previous day: 03/19 0701 - 03/20 0700 In: 3822.9 [P.O.:800; I.V.:2772.9; IV Piggyback:250] Out: 2180 [Urine:2180] Intake/Output this shift: No intake/output data recorded.  PE: Gen:  Alert, NAD, pleasant Card:  Regular rate and rhythm Pulm:  Normal effort, clear to auscultation bilaterally Abd: Soft, non-tender, non-distended, bowel sounds present in all 4 quadrants Skin: warm and dry, no rashes  Right perineum: Packing in place without any active drainage. Erythema improving. Mild to moderate induration is still present. Psych: A&Ox3   Lab Results:  Recent Labs    11/23/17 0116 11/23/17 0125 11/24/17 0512  WBC 11.9*  --  9.8  HGB 14.3 15.6 13.8  HCT 42.3 46.0 42.0  PLT 276  --  309   BMET Recent Labs    11/23/17 0125 11/24/17 0512  NA 141 139  K 3.6 4.2  CL 100* 102  CO2  --  27  GLUCOSE 152* 189*  BUN 9 11  CREATININE 0.80 0.76  CALCIUM  --  9.0   CMP     Component Value Date/Time   NA 139 11/24/2017 0512   K 4.2 11/24/2017 0512   CL 102 11/24/2017 0512   CO2 27 11/24/2017 0512   GLUCOSE 189 (H) 11/24/2017 0512   BUN 11 11/24/2017 0512   CREATININE 0.76 11/24/2017 0512   CALCIUM 9.0 11/24/2017  0512   PROT 6.9 11/21/2015 1535   ALBUMIN 4.2 11/21/2015 1535   AST 21 11/21/2015 1535   ALT 31 11/21/2015 1535   ALKPHOS 79 11/21/2015 1535   BILITOT 0.3 11/21/2015 1535   GFRNONAA >60 11/24/2017 0512   GFRAA >60 11/24/2017 0512   Studies/Results: Ct Abdomen Pelvis W Contrast  Result Date: 11/23/2017 CLINICAL DATA:  Right buttock abscess for 4 days. EXAM: CT ABDOMEN AND PELVIS WITH CONTRAST TECHNIQUE: Multidetector CT imaging of the abdomen and pelvis was performed using the standard protocol following bolus administration of intravenous contrast. CONTRAST:  100 cc ISOVUE-300 IOPAMIDOL (ISOVUE-300) INJECTION 61% COMPARISON:  None. FINDINGS: Lower chest: Normal heart size without pericardial effusion. Clear lung bases. Hepatobiliary: Homogeneous appearance of the liver without space-occupying mass. Normal gallbladder. No biliary dilatation. Pancreas: Normal Spleen: Normal Adrenals/Urinary Tract: Normal Stomach/Bowel: Colonic diverticulosis along the sigmoid colon without acute diverticulitis. Normal appendix. Unremarkable stomach and small intestine. Vascular/Lymphatic: Normal caliber aorta and iliac arteries. No pelvic sidewall lymphadenopathy or inguinal adenopathy. Reproductive: Unremarkable Other: Subcutaneous abscess at the junction of the right buttock and perineum measuring 2.9 x 7.4 x 4.5 cm with cellulitic subcutaneous soft tissue induration involving the adjacent buttock. Musculoskeletal: No acute or significant osseous findings. IMPRESSION: 1. Subcutaneous abscess at the junction of the right buttock and perineum measuring 2.9 x  7.4 x 4.5 cm with adjacent cellulitis. 2. Colonic diverticulosis without acute diverticulitis. 3. No acute solid organ pathology or adenopathy. Electronically Signed   By: Tollie Eth M.D.   On: 11/23/2017 02:32    Anti-infectives: Anti-infectives (From admission, onward)   Start     Dose/Rate Route Frequency Ordered Stop   11/23/17 1400  vancomycin (VANCOCIN)  1,250 mg in sodium chloride 0.9 % 250 mL IVPB     1,250 mg 166.7 mL/hr over 90 Minutes Intravenous Every 8 hours 11/23/17 0536     11/23/17 0330  vancomycin (VANCOCIN) 1,500 mg in sodium chloride 0.9 % 500 mL IVPB     1,500 mg 250 mL/hr over 120 Minutes Intravenous NOW 11/23/17 0320 11/23/17 0652   11/23/17 0045  vancomycin (VANCOCIN) IVPB 1000 mg/200 mL premix     1,000 mg 200 mL/hr over 60 Minutes Intravenous  Once 11/23/17 0041 11/23/17 0301   11/23/17 0045  levofloxacin (LEVAQUIN) IVPB 750 mg     750 mg 100 mL/hr over 90 Minutes Intravenous  Once 11/23/17 0041 11/23/17 0424     Assessment/Plan Perineal abscess POD#1 s/p Irrigation and Debridement 11/23/17 Dr. Axel Filler  - keep surgical packing today, plan to remove late this afternoon versus tomorrow morning and re-pack - continue IV abx and follow cultures (GS w/ G+ cocci, G+ rods, GNR) - pain control: allergic to tylenol/NSAIDs; 5 mg oxycodone PRN, IV for breakthrough - mobilize    FEN: carb mod ID: vancomycin 3/19>> VTE: lovenox  Foley: none    Anticipate patient will be stable for discharge home on oral antibiotics tomorrow with appropriate wound care plans in place. We will plan to follow up with him in the [facility] clinic.   LOS: 0 days    Adam Phenix , [facility] 11/24/2017, 7:54 AM Pager: [phone] Consults: [phone] Mon-Fri 7:00 am-4:30 pm Sat-Sun 7:00 am-11:30 am

## 2017-11-25 LAB — GLUCOSE, CAPILLARY
GLUCOSE-CAPILLARY: 170 mg/dL — AB (ref 65–99)
GLUCOSE-CAPILLARY: 160 mg/dL — AB (ref 65–99)
Glucose-Capillary: 154 mg/dL — ABNORMAL HIGH (ref 65–99)

## 2017-11-25 MED ORDER — IBUPROFEN 400 MG PO TABS: 400.0000 mg | ORAL_TABLET | ORAL | 0 refills | Status: AC

## 2017-11-25 MED ORDER — OXYCODONE HCL 5 MG PO TABS: 5.0000 mg | ORAL_TABLET | ORAL | 0 refills | Status: DC | PRN

## 2017-11-25 MED ORDER — DOXYCYCLINE HYCLATE 100 MG PO TBEC: 100.0000 mg | DELAYED_RELEASE_TABLET | Freq: Two times a day (BID) | ORAL | 0 refills | Status: AC

## 2017-11-25 MED ORDER — OXYCODONE HCL 5 MG PO TABS
5.0000 mg | ORAL_TABLET | ORAL | Status: DC | PRN
  Administered 2017-11-25: 5 mg via ORAL
  Filled 2017-11-25: qty 1

## 2017-11-25 MED ORDER — METFORMIN HCL 1000 MG PO TABS: 1000.0000 mg | ORAL_TABLET | Freq: Two times a day (BID) | ORAL | 0 refills | Status: DC

## 2017-11-25 NOTE — Progress Notes (Signed)
[facility] Progress Note  2 Days Post-Op  Subjective: CC: no complaints this AM. States walking causes mild pain around incision site. Tolerating PO. Denies fever, chills, nausea, or vomiting. Urinating and having flatus.  Objective: Vital signs in last 24 hours: Temp:  [98 F (36.7 C)-98.6 F (37 C)] 98.1 F (36.7 C) (03/21 0551) Pulse Rate:  [76-87] 76 (03/21 0551) Resp:  [17-18] 18 (03/21 0551) BP: (120-133)/(68-78) 120/73 (03/21 0551) SpO2:  [95 %-100 %] 96 % (03/21 0551) Last BM Date: 11/22/17  Intake/Output from previous day: 03/20 0701 - 03/21 0700 In: 2894 [P.O.:1080; I.V.:1314; IV Piggyback:500] Out: 2200 [Urine:2200] Intake/Output this shift: No intake/output data recorded.  PE: Gen:  Alert, NAD, pleasant Card:  Regular rate and rhythm Pulm:  Normal effort Abd: Soft, non-tender, non-distended Skin: warm and dry, no rashes Right perineum - 1/4" iodoform removed. No active bleeding. Appropriately tender. Wound re-packed with 1/2 of a 4x4 gauze moisted with normal saline and covered with dry gauze dressing.  Psych: A&Ox3   Lab Results:  Recent Labs    11/23/17 0116 11/23/17 0125 11/24/17 0512  WBC 11.9*  --  9.8  HGB 14.3 15.6 13.8  HCT 42.3 46.0 42.0  PLT 276  --  309   BMET Recent Labs    11/23/17 0125 11/24/17 0512  NA 141 139  K 3.6 4.2  CL 100* 102  CO2  --  27  GLUCOSE 152* 189*  BUN 9 11  CREATININE 0.80 0.76  CALCIUM  --  9.0   PT/INR No results for input(s): LABPROT, INR in the last 72 hours. CMP     Component Value Date/Time   NA 139 11/24/2017 0512   K 4.2 11/24/2017 0512   CL 102 11/24/2017 0512   CO2 27 11/24/2017 0512   GLUCOSE 189 (H) 11/24/2017 0512   BUN 11 11/24/2017 0512   CREATININE 0.76 11/24/2017 0512   CALCIUM 9.0 11/24/2017 0512   PROT 6.9 11/21/2015 1535   ALBUMIN 4.2 11/21/2015 1535   AST 21 11/21/2015 1535   ALT 31 11/21/2015 1535   ALKPHOS 79 11/21/2015 1535   BILITOT 0.3 11/21/2015 1535   GFRNONAA >60 11/24/2017 0512   GFRAA >60 11/24/2017 0512   Lipase  No results found for: LIPASE     Studies/Results: No results found.  Anti-infectives: Anti-infectives (From admission, onward)   Start     Dose/Rate Route Frequency Ordered Stop   11/25/17 0400  vancomycin (VANCOCIN) 1,250 mg in sodium chloride 0.9 % 250 mL IVPB     1,250 mg 166.7 mL/hr over 90 Minutes Intravenous Every 8 hours 11/24/17 1934     11/23/17 1400  vancomycin (VANCOCIN) 1,250 mg in sodium chloride 0.9 % 250 mL IVPB  Status:  Discontinued     1,250 mg 166.7 mL/hr over 90 Minutes Intravenous Every 8 hours 11/23/17 0536 11/24/17 1934   11/23/17 0330  vancomycin (VANCOCIN) 1,500 mg in sodium chloride 0.9 % 500 mL IVPB     1,500 mg 250 mL/hr over 120 Minutes Intravenous NOW 11/23/17 0320 11/23/17 0652   11/23/17 0045  vancomycin (VANCOCIN) IVPB 1000 mg/200 mL premix     1,000 mg 200 mL/hr over 60 Minutes Intravenous  Once 11/23/17 0041 11/23/17 0301   11/23/17 0045  levofloxacin (LEVAQUIN) IVPB 750 mg     750 mg 100 mL/hr over 90 Minutes Intravenous  Once 11/23/17 0041 11/23/17 0424       Assessment/Plan Perineal abscess POD#2 s/p Irrigation and Debridement 11/23/17  Dr. Axel Filler  - transition to PO abx x 1 week (empiric cipro is fine)  - practice good hygiene and shower daily - BID wet-to-dry dressing changes (mother will be at hospital today between 3-4 pm to observe dressing change, she will be helping patient at home).  - pain control: allergic to tylenol; scheudled ibuprofen, 5-10 mg oxycodone PRN, IV for breakthrough - mobilize  - Stable for discharge from surgical perspective. Should take ibuprofen and oxycodone at home 30 minutes prior to dressing change. Shower with wound open and then repack. F/U in our [facility] office in one week for a wound check.   FEN: carb mod ID: vancomycin 3/19-21, VTE: lovenox  Foley: none       LOS: 1 day    Rodney Delgado , [facility] 11/25/2017, 7:10 AM Pager: [phone] Consults: [phone] Mon-Fri 7:00 am-4:30 pm Sat-Sun 7:00 am-11:30 am

## 2017-11-25 NOTE — Discharge Summary (Signed)
Physician Discharge Summary  Freedom Peddy WUJ:[id number] DOB: [birth year] DOA: 11/23/2017  PCP: Patient, No Pcp Per  Admit date: 11/23/2017 Discharge date: 11/25/2017  Admitted From: Home Disposition:  Home  Discharge Condition:Stable  CODE STATUS:Full Diet recommendation:Carb Modified   Brief/Interim Summary: Patient is a 32 year old male with past medical history significant for diabetes type 2 not on meds who presented to the emergency room with complaint of pain, swelling of the right  buttock and perianal area.  Found to have abscess and cellulitis of his right buttock. Surgery evaluated him and  he underwent   Incision and drainage of abscess. Patient is doing very well on IV antibiotics.  Antibiotics has been changed to doxycycline on discharge which will be  continued for 7 more days. Patient will continue wound care at home.  He will also follow-up with general surgery as an outpatient. Patient also has been diagnosed with diabetes and has been started on metformin. Stable for discharge to home today.  Following problems were addressed during his hospitalization:   Abscess and cellulitis of the right buttock and perineal region: Underwent irrigation and debridement of perineal abscess on 11/23/17. General surgery was  following.  Continue oral antibiotics on DC.  Anaerobic cultures showed mixed organism.  MRSA PCR positive.  Blood culture no growth till date. Patient is afebrile.  No leukocytosis. Needs wound care on discharge.His mother will be drained for dressing changes. Continue pain management.  Follow-up with general surgery as an outpatient.  DM  type II:   He will be discharged on metformin.  Hemoglobin A1c found to be 7.5.  He was not on any medication at home.  He needs to follow-up with his PMD for close monitoring of his diabetes.  Check hemoglobin A1c in 3 months.    Discharge Diagnoses:  Principal Problem:   Cellulitis and abscess of buttock Active  Problems:   DM2 (diabetes mellitus, type 2) (HCC)    Discharge Instructions  Discharge Instructions    Diet - low sodium heart healthy   Complete by:  As directed    Discharge instructions   Complete by:  As directed    1) Take prescribed medications as instructed. 2) Practice good hygiene and shower daily     Twice a day wet-to-dry dressing changes.     Take ibuprofen and oxycodone at home 30 minutes prior to dressing change. Shower with wound open and then     repack.      3)F/U in  [facility] office in one week for a wound check. 4) You have been diagnosed with diabetes. We have started you on Metformin.Follow up with a rimary care physician as an outpatient. Check Hemoglobin A1C in 3 months.   Increase activity slowly   Complete by:  As directed      Allergies as of 11/25/2017      Reactions   Acetaminophen Other (See Comments)   Coughing that makes him feel like throat is closing   Nsaids Anaphylaxis   Tolerated Ibuprofen   Penicillins Rash   Has patient had a PCN reaction causing immediate rash, facial/tongue/throat swelling, SOB or lightheadedness with hypotension: Yes Has patient had a PCN reaction causing severe rash involving mucus membranes or skin necrosis: No Has patient had a PCN reaction that required hospitalization: No Has patient had a PCN reaction occurring within the last 10 years: No If all of the above answers are "NO", then may proceed with Cephalosporin use.      Medication List  TAKE these medications   doxycycline 100 MG EC tablet Commonly known as:  DORYX Take 1 tablet (100 mg total) by mouth 2 (two) times daily for 7 days.   ibuprofen 400 MG tablet Commonly known as:  ADVIL,MOTRIN Take 1 tablet (400 mg total) by mouth every 4 (four) hours.   metFORMIN 1000 MG tablet Commonly known as:  GLUCOPHAGE Take 1 tablet (1,000 mg total) by mouth 2 (two) times daily with a meal.   oxyCODONE 5 MG immediate release tablet Commonly known as:  Oxy  IR/ROXICODONE Take 1 tablet (5 mg total) by mouth every 4 (four) hours as needed for moderate pain.   terbinafine 250 MG tablet Commonly known as:  LAMISIL Take 1 tablet (250 mg total) by mouth daily.      Follow-up Information    [facility] Follow up.   Specialty:  General Surgery Why:  we are scheduling you for a wound check in 7-10 days. call to confirm appointment date/time. Contact information: [address] [place] [address] [phone]         Allergies  Allergen Reactions  . Acetaminophen Other (See Comments)    Coughing that makes him feel like throat is closing  . Nsaids Anaphylaxis    Tolerated Ibuprofen  . Penicillins Rash    Has patient had a PCN reaction causing immediate rash, facial/tongue/throat swelling, SOB or lightheadedness with hypotension: Yes Has patient had a PCN reaction causing severe rash involving mucus membranes or skin necrosis: No Has patient had a PCN reaction that required hospitalization: No Has patient had a PCN reaction occurring within the last 10 years: No If all of the above answers are "NO", then may proceed with Cephalosporin use.     Consultations:  Surgery   Procedures/Studies: Ct Abdomen Pelvis W Contrast  Result Date: 11/23/2017 CLINICAL DATA:  Right buttock abscess for 4 days. EXAM: CT ABDOMEN AND PELVIS WITH CONTRAST TECHNIQUE: Multidetector CT imaging of the abdomen and pelvis was performed using the standard protocol following bolus administration of intravenous contrast. CONTRAST:  100 cc ISOVUE-300 IOPAMIDOL (ISOVUE-300) INJECTION 61% COMPARISON:  None. FINDINGS: Lower chest: Normal heart size without pericardial effusion. Clear lung bases. Hepatobiliary: Homogeneous appearance of the liver without space-occupying mass. Normal gallbladder. No biliary dilatation. Pancreas: Normal Spleen: Normal Adrenals/Urinary Tract: Normal Stomach/Bowel: Colonic diverticulosis along  the sigmoid colon without acute diverticulitis. Normal appendix. Unremarkable stomach and small intestine. Vascular/Lymphatic: Normal caliber aorta and iliac arteries. No pelvic sidewall lymphadenopathy or inguinal adenopathy. Reproductive: Unremarkable Other: Subcutaneous abscess at the junction of the right buttock and perineum measuring 2.9 x 7.4 x 4.5 cm with cellulitic subcutaneous soft tissue induration involving the adjacent buttock. Musculoskeletal: No acute or significant osseous findings. IMPRESSION: 1. Subcutaneous abscess at the junction of the right buttock and perineum measuring 2.9 x 7.4 x 4.5 cm with adjacent cellulitis. 2. Colonic diverticulosis without acute diverticulitis. 3. No acute solid organ pathology or adenopathy. Electronically Signed   By: Tollie Eth M.D.   On: 11/23/2017 02:32   (Echo, Carotid, EGD, Colonoscopy, ERCP)    Subjective:  Patient seen and examined at bedside this morning.  His pain is significantly better today. Stable for discharge home today Discharge Exam: Vitals:   11/24/17 2158 11/25/17 0551  BP: 133/78 120/73  Pulse: 87 76  Resp: 18 18  Temp: 98 F (36.7 C) 98.1 F (36.7 C)  SpO2: 97% 96%   Vitals:   11/24/17 1400 11/24/17 1800 11/24/17 2158  11/25/17 0551  BP: 123/68 128/72 133/78 120/73  Pulse: 87 78 87 76  Resp: 17 18 18 18   Temp: 98.2 F (36.8 C) 98.6 F (37 C) 98 F (36.7 C) 98.1 F (36.7 C)  TempSrc: Oral Oral Oral Oral  SpO2: 95% 99% 97% 96%  Weight:      Height:        General: Pt is alert, awake, not in acute distress, morbidly obese Cardiovascular: RRR, S1/S2 +, no rubs, no gallops Respiratory: CTA bilaterally, no wheezing, no rhonchi Abdominal: Soft, NT, ND, bowel sounds + Extremities: no edema, no cyanosis    The results of significant diagnostics from this hospitalization (including imaging, microbiology, ancillary and laboratory) are listed below for reference.     Microbiology: Recent Results (from the past  240 hour(s))  Blood culture (routine x 2)     Status: None (Preliminary result)   Collection Time: 11/23/17  1:16 AM  Result Value Ref Range Status   Specimen Description   Final    BLOOD RIGHT ANTECUBITAL Performed at [facility], [address] [place], [place] [address]    Special Requests   Final    BOTTLES DRAWN AEROBIC AND ANAEROBIC Blood Culture adequate volume Performed at [facility], [address] [place], [place] [address]    Culture   Final    NO GROWTH 1 DAY Performed at [facility], [address] [place], [place] [address]    Report Status PENDING  Incomplete  Blood culture (routine x 2)     Status: None (Preliminary result)   Collection Time: 11/23/17  1:16 AM  Result Value Ref Range Status   Specimen Description   Final    BLOOD LEFT ANTECUBITAL Performed at [facility], [address] [place], [place] [address]    Special Requests   Final    BOTTLES DRAWN AEROBIC AND ANAEROBIC Blood Culture adequate volume Performed at [facility], [address] [place], [place] [address]    Culture   Final    NO GROWTH 1 DAY Performed at [facility], [address] [place], [place] [address]    Report Status PENDING  Incomplete  Surgical pcr screen     Status: Abnormal   Collection Time: 11/23/17  8:22 AM  Result Value Ref Range Status   MRSA, PCR NEGATIVE NEGATIVE Final   Staphylococcus aureus POSITIVE (A) NEGATIVE Final    Comment: (NOTE) The Xpert SA Assay (FDA approved for NASAL specimens in patients 30 years of age and older), is one component of a comprehensive surveillance program. It is not intended to diagnose infection nor to guide or monitor treatment. Performed at [facility], [address] [place], [place] [address]   Anaerobic culture     Status: None (Preliminary result)   Collection Time: 11/23/17  6:02 PM  Result Value Ref Range  Status   Specimen Description   Final    ABSCESS RIGHT BUTTOCKS Performed at [facility], [address] [place], [place] [address]    Special Requests   Final    PATIENT ON FOLLOWING VANCOMYCIN Performed at [facility], [address] [place], [place] [address]    Gram Stain   Final    ABUNDANT WBC PRESENT, PREDOMINANTLY PMN MODERATE GRAM POSITIVE COCCI RARE GRAM NEGATIVE RODS RARE GRAM POSITIVE RODS Performed at [facility], [address] [place], [place] [address]  Culture PENDING  Incomplete   Report Status PENDING  Incomplete  Aerobic Culture (superficial specimen)     Status: None (Preliminary result)   Collection Time: 11/23/17  6:02 PM  Result Value Ref Range Status   Specimen Description   Final    ABSCESS RIGHT BUTTOCKS Performed at [facility], [address] [place], [place] [address]    Special Requests   Final    PATIENT ON FOLLOWING VANCOMYCIN Performed at [facility], [address], [place], [place] [address]    Gram Stain PENDING  Incomplete   Culture   Final    NO GROWTH < 24 HOURS Performed at [facility], [address], [place], [place] [address]    Report Status PENDING  Incomplete     Labs: BNP (last 3 results) No results for input(s): BNP in the last 8760 hours. Basic Metabolic Panel: Recent Labs  Lab 11/23/17 0125 11/24/17 0512  NA 141 139  K 3.6 4.2  CL 100* 102  CO2  --  27  GLUCOSE 152* 189*  BUN 9 11  CREATININE 0.80 0.76  CALCIUM  --  9.0   Liver Function Tests: No results for input(s): AST, ALT, ALKPHOS, BILITOT, PROT, ALBUMIN in the last 168 hours. No results for input(s): LIPASE, AMYLASE in the last 168 hours. No results for input(s): AMMONIA in the last 168 hours. CBC: Recent Labs  Lab 11/23/17 0116 11/23/17 0125 11/24/17 0512  WBC 11.9*  --  9.8  NEUTROABS 6.8  --   --   HGB 14.3 15.6 13.8  HCT 42.3 46.0 42.0  MCV  84.3  --  84.7  PLT 276  --  309   Cardiac Enzymes: No results for input(s): CKTOTAL, CKMB, CKMBINDEX, TROPONINI in the last 168 hours. BNP: Invalid input(s): POCBNP CBG: Recent Labs  Lab 11/24/17 1950 11/24/17 2353 11/25/17 0408 11/25/17 0724 11/25/17 1108  GLUCAP 193* 146* 154* 170* 160*   D-Dimer No results for input(s): DDIMER in the last 72 hours. Hgb A1c Recent Labs    11/23/17 0657  HGBA1C 7.5*   Lipid Profile No results for input(s): CHOL, HDL, LDLCALC, TRIG, CHOLHDL, LDLDIRECT in the last 72 hours. Thyroid function studies No results for input(s): TSH, T4TOTAL, T3FREE, THYROIDAB in the last 72 hours.  Invalid input(s): FREET3 Anemia work up No results for input(s): VITAMINB12, FOLATE, FERRITIN, TIBC, IRON, RETICCTPCT in the last 72 hours. Urinalysis No results found for: COLORURINE, APPEARANCEUR, LABSPEC, PHURINE, GLUCOSEU, HGBUR, BILIRUBINUR, KETONESUR, PROTEINUR, UROBILINOGEN, NITRITE, LEUKOCYTESUR Sepsis Labs Invalid input(s): PROCALCITONIN,  WBC,  LACTICIDVEN Microbiology Recent Results (from the past 240 hour(s))  Blood culture (routine x 2)     Status: None (Preliminary result)   Collection Time: 11/23/17  1:16 AM  Result Value Ref Range Status   Specimen Description   Final    BLOOD RIGHT ANTECUBITAL Performed at [facility], [address] [place], [place] [address]    Special Requests   Final    BOTTLES DRAWN AEROBIC AND ANAEROBIC Blood Culture adequate volume Performed at [facility], [address] [place], [place] [address]    Culture   Final    NO GROWTH 1 DAY Performed at [facility], [address] [place], [place] [address]    Report Status PENDING  Incomplete  Blood culture (routine x 2)     Status: None (Preliminary result)   Collection Time: 11/23/17  1:16 AM  Result Value  Ref Range Status   Specimen Description   Final    BLOOD LEFT ANTECUBITAL Performed at [facility], [address], [place], [place] [address]    Special Requests   Final    BOTTLES DRAWN AEROBIC AND ANAEROBIC Blood Culture adequate volume Performed at [facility], [address] [place], [place] [address]    Culture   Final    NO GROWTH 1 DAY Performed at [facility], [address] [place], [place] [address]    Report Status PENDING  Incomplete  Surgical pcr screen     Status: Abnormal   Collection Time: 11/23/17  8:22 AM  Result Value Ref Range Status   MRSA, PCR NEGATIVE NEGATIVE Final   Staphylococcus aureus POSITIVE (A) NEGATIVE Final    Comment: (NOTE) The Xpert SA Assay (FDA approved for NASAL specimens in patients 32 years of age and older), is one component of a comprehensive surveillance program. It is not intended to diagnose infection nor to guide or monitor treatment. Performed at [facility], [address] [place], [place] [address]   Anaerobic culture     Status: None (Preliminary result)   Collection Time: 11/23/17  6:02 PM  Result Value Ref Range Status   Specimen Description   Final    ABSCESS RIGHT BUTTOCKS Performed at [facility], [address], [place], [place] [address]    Special Requests   Final    PATIENT ON FOLLOWING VANCOMYCIN Performed at [facility], [address], [place], [place] [address]    Gram Stain   Final    ABUNDANT WBC PRESENT, PREDOMINANTLY PMN MODERATE GRAM POSITIVE COCCI RARE GRAM NEGATIVE RODS RARE GRAM POSITIVE RODS Performed at [facility], [address] [place], [place] [address]    Culture PENDING  Incomplete   Report Status PENDING  Incomplete  Aerobic Culture (superficial specimen)     Status: None (Preliminary result)   Collection Time: 11/23/17  6:02 PM  Result Value Ref Range Status   Specimen Description   Final    ABSCESS RIGHT BUTTOCKS Performed at [facility], [address] [place], [place] [address]    Special Requests   Final    PATIENT ON FOLLOWING VANCOMYCIN Performed at [facility], [address], [place], [place] [address]    Gram Stain PENDING  Incomplete   Culture   Final    NO GROWTH < 24 HOURS Performed at [facility], [address], [place], [place] [address]    Report Status PENDING  Incomplete     Time coordinating discharge: Over 30 minutes  SIGNED:   Burnadette Pop, MD  [facility] 11/25/2017, 12:07 PM Pager [phone]  If 7PM-7AM, please contact night-coverage [email or web address] Password [email or web address]

## 2017-11-25 NOTE — Progress Notes (Signed)
Marisue IvanLiz, [place], for [facility] in to teach mother of pt how to change dressing at home. Mom verbalized understanding, stated "that looks easy." Supplies provided for home.

## 2017-11-25 NOTE — Discharge Instructions (Signed)
Fingerstick glucose (sugar) goals for home: Before meals: 80-130 mg/dl 2-Hours after meals: less than 180 mg/dl Hemoglobin [id number] goal: 7% or less  Avoid beverages with sugar (regular soda, sweet tea, lemonade, fruit juice) and consume mostly water.  Be careful with portion sizes (especially grains, starchy vegetables, and fruits).  Men can have 60-75 grams of carbohydrates per meal per day.  WOUND CARE: -  dressing to be changed twice daily - supplies: normal saline, 4x4 gauze, scissors, tape  - remove dressing and all packing carefully, moistening with saline as needed to avoid packing/internal dressing sticking to the wound. - clean edges of skin around the wound with water/gauze, making sure there is no tape debris or leakage left on skin that could cause skin irritation or breakdown. - dampen a clean gauze with saline and pack wound from wound base to skin level, making sure to take note of any possible areas of wound tracking, tunneling and packing appropriately. Wound can be packed loosely. Trim gauze to size if a whole gauze is not required. - cover wound with a dry gauze and secure with tape if necessary.  - apply any skin protectant/powder recommended by clinician to protect skin/skin folds. - change dressing as needed if leakage occurs, wound gets contaminated, or patient requests to shower. - patient may shower daily with wound open and following the shower the wound should be dried and a clean dressing placed.

## 2017-11-26 LAB — FUNGUS STAIN

## 2017-11-26 LAB — FUNGAL STAIN REFLEX

## 2017-11-27 LAB — AEROBIC CULTURE W GRAM STAIN (SUPERFICIAL SPECIMEN)

## 2017-11-28 LAB — ANAEROBIC CULTURE

## 2017-11-28 LAB — CULTURE, BLOOD (ROUTINE X 2)
Culture: NO GROWTH
Culture: NO GROWTH
SPECIAL REQUESTS: ADEQUATE
Special Requests: ADEQUATE

## 2017-12-20 ENCOUNTER — Encounter (HOSPITAL_COMMUNITY): Payer: Self-pay | Admitting: General Surgery

## 2018-01-18 ENCOUNTER — Encounter (HOSPITAL_COMMUNITY): Payer: Self-pay | Admitting: Family Medicine

## 2018-01-18 ENCOUNTER — Ambulatory Visit (HOSPITAL_COMMUNITY)
Admission: EM | Admit: 2018-01-18 | Discharge: 2018-01-18 | Disposition: A | Discharge status: Home / Self Care | Payer: BLUE CROSS/BLUE SHIELD | Attending: Family Medicine | Admitting: Family Medicine

## 2018-01-18 DIAGNOSIS — J029 Acute pharyngitis, unspecified: Secondary | ICD-10-CM | POA: Diagnosis not present

## 2018-01-18 DIAGNOSIS — R05 Cough: Secondary | ICD-10-CM | POA: Diagnosis not present

## 2018-01-18 DIAGNOSIS — R0602 Shortness of breath: Secondary | ICD-10-CM

## 2018-01-18 DIAGNOSIS — J069 Acute upper respiratory infection, unspecified: Secondary | ICD-10-CM | POA: Diagnosis not present

## 2018-01-18 MED ORDER — ALBUTEROL SULFATE HFA 108 (90 BASE) MCG/ACT IN AERS: 1.0000 | INHALATION_SPRAY | Freq: Four times a day (QID) | RESPIRATORY_TRACT | 0 refills | Status: AC | PRN

## 2018-01-18 MED ORDER — IPRATROPIUM-ALBUTEROL 0.5-2.5 (3) MG/3ML IN SOLN
3.0000 mL | Freq: Once | RESPIRATORY_TRACT | Status: AC
  Administered 2018-01-18: 3 mL via RESPIRATORY_TRACT

## 2018-01-18 MED ORDER — IPRATROPIUM-ALBUTEROL 0.5-2.5 (3) MG/3ML IN SOLN
RESPIRATORY_TRACT | Status: AC
Start: 2018-01-18 — End: ?
  Filled 2018-01-18: qty 3

## 2018-01-18 MED ORDER — FLUTICASONE PROPIONATE 50 MCG/ACT NA SUSP: 1.0000 | Freq: Every day | NASAL | 2 refills | Status: AC

## 2018-01-18 NOTE — ED Provider Notes (Signed)
[facility]    CSN: [id number] Arrival date & time: 01/18/18  1021     History   Chief Complaint Chief Complaint  Patient presents with  . Nasal Congestion  . Cough    HPI Rodney Delgado is a 32 y.o. male.   Rodney Delgado presents with complaints of cough, congestion, runny nose, fatigue, chills, sore throat with cough which started yesterday. Son with cold at home. Denies nausea, vomiting, diarrhea. Normal urination. Took allegra d which has not seemed to help. Feels short of breath related to congestion. Cough is productive of mucus. Denies chest pain. Hx of asthma, denies any known wheezing. Does not have an inhaler. Takes metformin for diabetes, otherwise without medical history.   ROS per HPI.      Past Medical History:  Diagnosis Date  . DM2 (diabetes mellitus, type 2) (HCC)   . Morbid obesity with BMI of 40.0-44.9, adult [facility])     Patient Active Problem List   Diagnosis Date Noted  . Cellulitis and abscess of buttock 11/23/2017  . DM2 (diabetes mellitus, type 2) (HCC) 11/23/2017    Past Surgical History:  Procedure Laterality Date  . IRRIGATION AND DEBRIDEMENT ABSCESS N/A 11/23/2017   Procedure: IRRIGATION AND DEBRIDEMENTPERINEAL ABSCESS;  Surgeon: Axel Filler, MD;  Location: [facility];  Service: General;  Laterality: N/A;       Home Medications    Prior to Admission medications   Medication Sig Start Date End Date Taking? Authorizing Provider  albuterol (PROVENTIL HFA;VENTOLIN HFA) 108 (90 Base) MCG/ACT inhaler Inhale 1-2 puffs into the lungs every 6 (six) hours as needed for wheezing or shortness of breath. 01/18/18   Georgetta Haber, NP  fluticasone (FLONASE) 50 MCG/ACT nasal spray Place 1 spray into both nostrils daily. 01/18/18   Georgetta Haber, NP  ibuprofen (ADVIL,MOTRIN) 400 MG tablet Take 1 tablet (400 mg total) by mouth every 4 (four) hours. 11/25/17   Burnadette Pop, MD  metFORMIN (GLUCOPHAGE) 1000 MG tablet Take 1 tablet (1,000 mg  total) by mouth 2 (two) times daily with a meal. 11/25/17 12/25/17  Burnadette Pop, MD  oxyCODONE (OXY IR/ROXICODONE) 5 MG immediate release tablet Take 1 tablet (5 mg total) by mouth every 4 (four) hours as needed for moderate pain. 11/25/17   Burnadette Pop, MD  terbinafine (LAMISIL) 250 MG tablet Take 1 tablet (250 mg total) by mouth daily. Patient not taking: Reported on 11/23/2017 11/18/15   Lenn Sink, DPM    Family History History reviewed. No pertinent family history.  Social History Social History   Tobacco Use  . Smoking status: Current Some Day Smoker  . Smokeless tobacco: Never Used  Substance Use Topics  . Alcohol use: Yes    Alcohol/week: 0.0 oz    Comment: social  . Drug use: No     Allergies   Acetaminophen; Nsaids; and Penicillins   Review of Systems Review of Systems   Physical Exam Triage Vital Signs ED Triage Vitals  Enc Vitals Group     BP 01/18/18 1107 (!) 143/88     Pulse Rate 01/18/18 1107 (!) 110     Resp 01/18/18 1107 18     Temp 01/18/18 1107 98.4 F (36.9 C)     Temp Source 01/18/18 1107 Oral     SpO2 01/18/18 1107 94 %     Weight --      Height --      Head Circumference --      Peak Flow --  Pain Score 01/18/18 1110 4     Pain Loc --      Pain Edu? --      Excl. in GC? --    No data found.  Updated Vital Signs BP (!) 143/88 (BP Location: Right Arm)   Pulse (!) 110   Temp 98.4 F (36.9 C) (Oral)   Resp 18   SpO2 97%   Visual Acuity Right Eye Distance:   Left Eye Distance:   Bilateral Distance:    Right Eye Near:   Left Eye Near:    Bilateral Near:     Physical Exam  Constitutional: He is oriented to person, place, and time. He appears well-developed and well-nourished.  HENT:  Head: Normocephalic and atraumatic.  Right Ear: Tympanic membrane, external ear and ear canal normal.  Left Ear: Tympanic membrane, external ear and ear canal normal.  Nose: Rhinorrhea present. Right sinus exhibits no maxillary sinus  tenderness and no frontal sinus tenderness. Left sinus exhibits no maxillary sinus tenderness and no frontal sinus tenderness.  Mouth/Throat: Uvula is midline, oropharynx is clear and moist and mucous membranes are normal.  Eyes: Pupils are equal, round, and reactive to light. Conjunctivae are normal.  Neck: Normal range of motion.  Cardiovascular: Regular rhythm. Tachycardia present.  Pulmonary/Chest: Effort normal. He has decreased breath sounds. He has no wheezes. He has no rhonchi.  Strong congested cough noted   Lymphadenopathy:    He has no cervical adenopathy.  Neurological: He is alert and oriented to person, place, and time.  Skin: Skin is warm and dry.  Vitals reviewed.    UC Treatments / Results  Labs (all labs ordered are listed, but only abnormal results are displayed) Labs Reviewed - No data to display  EKG None  Radiology No results found.  Procedures Procedures (including critical care time)  Medications Ordered in UC Medications  ipratropium-albuterol (DUONEB) 0.5-2.5 (3) MG/3ML nebulizer solution 3 mL (3 mLs Nebulization Given 01/18/18 1135)    Initial Impression / Assessment and Plan / UC Course  I have reviewed the triage vital signs and the nursing notes.  Pertinent labs & imaging results that were available during my care of the patient were reviewed by me and considered in my medical decision making (see chart for details).     Per chart review 08/2016 was seen for viral URI and noted to have hr 120 and O2 95%, similar to today's vitals. O2 improved with Duoneb and patietn states feels improved. Non toxic in appearance and otherwise without any redflag findings. Afebrile. Eating and drinking. Son with URI. History and physical consistent with viral illness.  Supportive cares recommended. Patient verbalized understanding and agreeable to plan.     Final Clinical Impressions(s) / UC Diagnoses   Final diagnoses:  Upper respiratory tract infection,  unspecified type     Discharge Instructions     Push fluids to ensure adequate hydration and keep secretions thin.  Tylenol and/or ibuprofen as needed for pain or fevers.  May continue with OTC allegra d. Daily flonase. Use of inhaler as needed.  If symptoms worsen or do not improve in the next week to return to be seen or to follow up with your PCP.      ED Prescriptions    Medication Sig Dispense Auth. Provider   albuterol (PROVENTIL HFA;VENTOLIN HFA) 108 (90 Base) MCG/ACT inhaler Inhale 1-2 puffs into the lungs every 6 (six) hours as needed for wheezing or shortness of breath. 1 Inhaler Georgetta Haber,  NP   fluticasone (FLONASE) 50 MCG/ACT nasal spray Place 1 spray into both nostrils daily. 16 g Georgetta Haber, NP     Controlled Substance Prescriptions [place] Controlled Substance Registry consulted? Not Applicable   Georgetta Haber, NP 01/18/18 1151

## 2018-01-18 NOTE — Discharge Instructions (Signed)
Push fluids to ensure adequate hydration and keep secretions thin.  Tylenol and/or ibuprofen as needed for pain or fevers.  May continue with OTC allegra d. Daily flonase. Use of inhaler as needed.  If symptoms worsen or do not improve in the next week to return to be seen or to follow up with your PCP.

## 2018-01-18 NOTE — ED Triage Notes (Signed)
Pt here for cough and head congestion x 2 days. He has been taking allegra d. sts fever yesterday.

## 2018-03-01 ENCOUNTER — Encounter (HOSPITAL_COMMUNITY): Payer: Self-pay | Admitting: Emergency Medicine

## 2018-03-01 ENCOUNTER — Ambulatory Visit (HOSPITAL_COMMUNITY)
Admission: EM | Admit: 2018-03-01 | Discharge: 2018-03-01 | Disposition: A | Discharge status: Home / Self Care | Payer: BLUE CROSS/BLUE SHIELD | Attending: Family Medicine | Admitting: Family Medicine

## 2018-03-01 DIAGNOSIS — H1032 Unspecified acute conjunctivitis, left eye: Secondary | ICD-10-CM

## 2018-03-01 MED ORDER — TETRACAINE HCL 0.5 % OP SOLN
OPHTHALMIC | Status: AC
  Filled 2018-03-01: qty 4

## 2018-03-01 MED ORDER — POLYMYXIN B-TRIMETHOPRIM 10000-0.1 UNIT/ML-% OP SOLN: 1.0000 [drp] | OPHTHALMIC | 0 refills | Status: AC

## 2018-03-01 MED ORDER — FLUORESCEIN SODIUM 1 MG OP STRP
ORAL_STRIP | OPHTHALMIC | Status: AC
  Filled 2018-03-01: qty 1

## 2018-03-01 NOTE — ED Triage Notes (Signed)
PT woke up with left eye redness and discomfort this AM

## 2018-03-01 NOTE — ED Provider Notes (Signed)
[facility]    CSN: [id number] Arrival date & time: 03/01/18  1306     History   Chief Complaint Chief Complaint  Patient presents with  . Eye Problem    HPI Rodney Delgado is a 32 y.o. male history of obesity, DM type II presenting today for evaluation of left eye redness.  Symptoms began this morning, he woke up with left eye redness, irritation and significant amount of drainage.  He has had persistent crusting throughout the day.  Denies any pain, but does endorse a "soreness".  Denies changes in vision.  Patient was glasses, but denies contact use.  Denies contacts with similar symptoms.  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  Denies associated URI symptoms.  HPI  Past Medical History:  Diagnosis Date  . DM2 (diabetes mellitus, type 2) (HCC)   . Morbid obesity with BMI of 40.0-44.9, adult [facility])     Patient Active Problem List   Diagnosis Date Noted  . Cellulitis and abscess of buttock 11/23/2017  . DM2 (diabetes mellitus, type 2) (HCC) 11/23/2017    Past Surgical History:  Procedure Laterality Date  . IRRIGATION AND DEBRIDEMENT ABSCESS N/A 11/23/2017   Procedure: IRRIGATION AND DEBRIDEMENTPERINEAL ABSCESS;  Surgeon: Axel Filler, MD;  Location: [facility];  Service: General;  Laterality: N/A;       Home Medications    Prior to Admission medications   Medication Sig Start Date End Date Taking? Authorizing Provider  albuterol (PROVENTIL HFA;VENTOLIN HFA) 108 (90 Base) MCG/ACT inhaler Inhale 1-2 puffs into the lungs every 6 (six) hours as needed for wheezing or shortness of breath. 01/18/18   Georgetta Haber, NP  fluticasone (FLONASE) 50 MCG/ACT nasal spray Place 1 spray into both nostrils daily. 01/18/18   Georgetta Haber, NP  ibuprofen (ADVIL,MOTRIN) 400 MG tablet Take 1 tablet (400 mg total) by mouth every 4 (four) hours. 11/25/17   Burnadette Pop, MD  metFORMIN (GLUCOPHAGE) 1000 MG  tablet Take 1 tablet (1,000 mg total) by mouth 2 (two) times daily with a meal. 11/25/17 12/25/17  Burnadette Pop, MD  oxyCODONE (OXY IR/ROXICODONE) 5 MG immediate release tablet Take 1 tablet (5 mg total) by mouth every 4 (four) hours as needed for moderate pain. 11/25/17   Burnadette Pop, MD  terbinafine (LAMISIL) 250 MG tablet Take 1 tablet (250 mg total) by mouth daily. Patient not taking: Reported on 11/23/2017 11/18/15   Lenn Sink, DPM  trimethoprim-polymyxin b (POLYTRIM) ophthalmic solution Place 1 drop into the left eye every 4 (four) hours for 7 days. 03/01/18 03/08/18  Wieters, Junius Creamer, PA-C    Family History No family history on file.  Social History Social History   Tobacco Use  . Smoking status: Current Some Day Smoker  . Smokeless tobacco: Never Used  Substance Use Topics  . Alcohol use: Yes    Alcohol/week: 0.0 oz    Comment: social  . Drug use: No     Allergies   Acetaminophen; Nsaids; and Penicillins   Review of Systems Review of Systems  Constitutional: Negative for activity change, appetite change, chills, fatigue and fever.  HENT: Negative for congestion, ear pain, rhinorrhea, sinus pressure, sore throat and trouble swallowing.   Eyes: Positive for discharge and redness. Negative for photophobia and visual disturbance.  Respiratory: Negative for cough, chest tightness and shortness of breath.   Cardiovascular: Negative for chest pain.  Gastrointestinal: Negative for  abdominal pain, diarrhea, nausea and vomiting.  Musculoskeletal: Negative for myalgias.  Skin: Negative for rash.  Neurological: Negative for dizziness, light-headedness and headaches.     Physical Exam Triage Vital Signs ED Triage Vitals [03/01/18 1340]  Enc Vitals Group     BP (!) 145/86     Pulse Rate (!) 101     Resp 20     Temp 98 F (36.7 C)     Temp Source Oral     SpO2 98 %     Weight (!) 340 lb (154.2 kg)     Height      Head Circumference      Peak Flow      Pain  Score 2     Pain Loc      Pain Edu?      Excl. in GC?    No data found.  Updated Vital Signs BP (!) 145/86   Pulse (!) 101   Temp 98 F (36.7 C) (Oral)   Resp 20   Wt (!) 340 lb (154.2 kg)   SpO2 98%   BMI 42.50 kg/m   Visual Acuity Right Eye Distance:  20/40 Left Eye Distance:  20/40 Bilateral Distance:  20/30  Right Eye Near:   Left Eye Near:    Bilateral Near:     Physical Exam  Constitutional: He appears well-developed and well-nourished.  HENT:  Head: Normocephalic and atraumatic.  Mouth/Throat: Oropharynx is clear and moist.  Bilateral TMs nonerythematous, EACs clear  Eyes: Pupils are equal, round, and reactive to light. Conjunctivae and EOM are normal.  Left eye conjunctiva erythematous, mild amount of watery drainage as well as crusting on eyelashes present in the left eye.  No ulcer, abrasion or dendrites seen on fluorescein staining.  Neck: Neck supple.  Cardiovascular: Normal rate and regular rhythm.  No murmur heard. Pulmonary/Chest: Effort normal and breath sounds normal. No respiratory distress.  Abdominal: Soft. There is no tenderness.  Musculoskeletal: He exhibits no edema.  Neurological: He is alert.  Skin: Skin is warm and dry.  Psychiatric: He has a normal mood and affect.  Nursing note and vitals reviewed.    UC Treatments / Results  Labs (all labs ordered are listed, but only abnormal results are displayed) Labs Reviewed - No data to display  EKG None  Radiology No results found.  Procedures Procedures (including critical care time)  Medications Ordered in UC Medications - No data to display  Initial Impression / Assessment and Plan / UC Course  I have reviewed the triage vital signs and the nursing notes.  Pertinent labs & imaging results that were available during my care of the patient were reviewed by me and considered in my medical decision making (see chart for details).     Patient with conjunctivitis, bacterial  versus viral.  No red flags, no change in visual acuity between right and left eye.  We will go ahead and treat for bacterial with Polytrim eyedrops.  Discussed hand hygiene, cool compresses.Discussed strict return precautions. Patient verbalized understanding and is agreeable with plan.  Final Clinical Impressions(s) / UC Diagnoses   Final diagnoses:  Acute bacterial conjunctivitis of left eye     Discharge Instructions     Bacterial Conjunctivitis  - Use Polytrim eye drops (2 drops 4 times a day for 5-7 days)  - Have Good Hand Hygiene  - Use Cold Compresses      ED Prescriptions    Medication Sig Dispense Auth. Provider  trimethoprim-polymyxin b (POLYTRIM) ophthalmic solution Place 1 drop into the left eye every 4 (four) hours for 7 days. 10 mL Wieters, Hallie C, PA-C     Controlled Substance Prescriptions [place] Controlled Substance Registry consulted? Not Applicable   Lew Dawes, [place] 03/01/18 1439

## 2018-03-01 NOTE — Discharge Instructions (Signed)
Bacterial Conjunctivitis  °- Use Polytrim eye drops (2 drops 4 times a day for 5-7 days)  °- Have Good Hand Hygiene  °- Use Cold Compresses  ° ° ° °

## 2018-08-25 ENCOUNTER — Ambulatory Visit: Payer: Self-pay | Admitting: Podiatry

## 2018-09-02 ENCOUNTER — Encounter: Payer: Self-pay | Admitting: Podiatry

## 2018-09-02 ENCOUNTER — Ambulatory Visit (INDEPENDENT_AMBULATORY_CARE_PROVIDER_SITE_OTHER): Payer: BLUE CROSS/BLUE SHIELD | Admitting: Podiatry

## 2018-09-02 VITALS — BP 141/87

## 2018-09-02 DIAGNOSIS — B351 Tinea unguium: Secondary | ICD-10-CM

## 2018-09-02 DIAGNOSIS — M79675 Pain in left toe(s): Secondary | ICD-10-CM | POA: Diagnosis not present

## 2018-09-02 DIAGNOSIS — M79674 Pain in right toe(s): Secondary | ICD-10-CM | POA: Diagnosis not present

## 2018-09-02 DIAGNOSIS — E119 Type 2 diabetes mellitus without complications: Secondary | ICD-10-CM | POA: Diagnosis not present

## 2018-09-02 DIAGNOSIS — L84 Corns and callosities: Secondary | ICD-10-CM

## 2018-09-02 MED ORDER — UREA 39 % EX CREA: TOPICAL_CREAM | CUTANEOUS | 4 refills | Status: AC

## 2018-09-02 NOTE — Patient Instructions (Signed)
Diabetes Mellitus and Foot Care  Foot care is an important part of your health, especially when you have diabetes. Diabetes may cause you to have problems because of poor blood flow (circulation) to your feet and legs, which can cause your skin to:   Become thinner and drier.   Break more easily.   Heal more slowly.   Peel and crack.  You may also have nerve damage (neuropathy) in your legs and feet, causing decreased feeling in them. This means that you may not notice minor injuries to your feet that could lead to more serious problems. Noticing and addressing any potential problems early is the best way to prevent future foot problems.  How to care for your feet  Foot hygiene   Wash your feet daily with warm water and mild soap. Do not use hot water. Then, pat your feet and the areas between your toes until they are completely dry. Do not soak your feet as this can dry your skin.   Trim your toenails straight across. Do not dig under them or around the cuticle. File the edges of your nails with an emery board or nail file.   Apply a moisturizing lotion or petroleum jelly to the skin on your feet and to dry, brittle toenails. Use lotion that does not contain alcohol and is unscented. Do not apply lotion between your toes.  Shoes and socks   Wear clean socks or stockings every day. Make sure they are not too tight. Do not wear knee-high stockings since they may decrease blood flow to your legs.   Wear shoes that fit properly and have enough cushioning. Always look in your shoes before you put them on to be sure there are no objects inside.   To break in new shoes, wear them for just a few hours a day. This prevents injuries on your feet.  Wounds, scrapes, corns, and calluses   Check your feet daily for blisters, cuts, bruises, sores, and redness. If you cannot see the bottom of your feet, use a mirror or ask someone for help.   Do not cut corns or calluses or try to remove them with medicine.   If you  find a minor scrape, cut, or break in the skin on your feet, keep it and the skin around it clean and dry. You may clean these areas with mild soap and water. Do not clean the area with peroxide, alcohol, or iodine.   If you have a wound, scrape, corn, or callus on your foot, look at it several times a day to make sure it is healing and not infected. Check for:  ? Redness, swelling, or pain.  ? Fluid or blood.  ? Warmth.  ? Pus or a bad smell.  General instructions   Do not cross your legs. This may decrease blood flow to your feet.   Do not use heating pads or hot water bottles on your feet. They may burn your skin. If you have lost feeling in your feet or legs, you may not know this is happening until it is too late.   Protect your feet from hot and cold by wearing shoes, such as at the beach or on hot pavement.   Schedule a complete foot exam at least once a year (annually) or more often if you have foot problems. If you have foot problems, report any cuts, sores, or bruises to your health care provider immediately.  Contact a health care provider if:     You have a medical condition that increases your risk of infection and you have any cuts, sores, or bruises on your feet.   You have an injury that is not healing.   You have redness on your legs or feet.   You feel burning or tingling in your legs or feet.   You have pain or cramps in your legs and feet.   Your legs or feet are numb.   Your feet always feel cold.   You have pain around a toenail.  Get help right away if:   You have a wound, scrape, corn, or callus on your foot and:  ? You have pain, swelling, or redness that gets worse.  ? You have fluid or blood coming from the wound, scrape, corn, or callus.  ? Your wound, scrape, corn, or callus feels warm to the touch.  ? You have pus or a bad smell coming from the wound, scrape, corn, or callus.  ? You have a fever.  ? You have a red line going up your leg.  Summary   Check your feet every day  for cuts, sores, red spots, swelling, and blisters.   Moisturize feet and legs daily.   Wear shoes that fit properly and have enough cushioning.   If you have foot problems, report any cuts, sores, or bruises to your health care provider immediately.   Schedule a complete foot exam at least once a year (annually) or more often if you have foot problems.  This information is not intended to replace advice given to you by your health care provider. Make sure you discuss any questions you have with your health care provider.  Document Released: 08/21/2000 Document Revised: 10/06/2017 Document Reviewed: 09/25/2016  [employer] Interactive Patient Education  2019 [employer]

## 2018-10-01 NOTE — Progress Notes (Signed)
Subjective: Rodney Delgado presents to clinic today with bilateral foot pain.  Patient was referred by [facility] urgent [facility] for bilateral foot pain.  Patient has history of diabetes, being diagnosed within the last month.  He takes metformin for his diabetes.  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  He relates being on his feet more than 40 hours/week.  Patient states he works up to 10 to 11 hours a day up to 7 days a week.  He relates pain is located on the ball of both feet.   Past Medical History:  Diagnosis Date  . DM2 (diabetes mellitus, type 2) (HCC)   . Morbid obesity with BMI of 40.0-44.9, adult [facility](HCC)      Patient Active Problem List   Diagnosis Date Noted  . Cellulitis and abscess of buttock 11/23/2017  . DM2 (diabetes mellitus, type 2) (HCC) 11/23/2017  . Hypertriglyceridemia 02/06/2017  . Morbid obesity with BMI of 40.0-44.9, adult (HCC) 02/06/2017  . Uncontrolled type 2 diabetes mellitus with hyperglycemia (HCC) 02/06/2017  . Wellness examination 01/19/2017  . Erectile dysfunction 08/22/2016     Past Surgical History:  Procedure Laterality Date  . IRRIGATION AND DEBRIDEMENT ABSCESS N/A 11/23/2017   Procedure: IRRIGATION AND DEBRIDEMENTPERINEAL ABSCESS;  Surgeon: Axel Filleramirez, Armando, MD;  Location: [facility];  Service: General;  Laterality: N/A;      Current Outpatient Medications:  .  albuterol (PROVENTIL HFA;VENTOLIN HFA) 108 (90 Base) MCG/ACT inhaler, Inhale 1-2 puffs into the lungs every 6 (six) hours as needed for wheezing or shortness of breath., Disp: 1 Inhaler, Rfl: 0 .  ibuprofen (ADVIL,MOTRIN) 400 MG tablet, Take 1 tablet (400 mg total) by mouth every 4 (four) hours., Disp: 30 tablet, Rfl: 0 .  sildenafil (REVATIO) 20 MG tablet, Take 3-5 tablets as needed, Disp: , Rfl:  .  fluticasone (FLONASE) 50 MCG/ACT nasal spray, Place 1 spray into both nostrils daily. (Patient not taking: Reported on 09/02/2018), Disp: 16 g, Rfl: 2 .  metFORMIN (GLUCOPHAGE)  1000 MG tablet, Take 1 tablet (1,000 mg total) by mouth 2 (two) times daily with a meal., Disp: 60 tablet, Rfl: 0 .  oxyCODONE (OXY IR/ROXICODONE) 5 MG immediate release tablet, Take 1 tablet (5 mg total) by mouth every 4 (four) hours as needed for moderate pain. (Patient not taking: Reported on 09/02/2018), Disp: 20 tablet, Rfl: 0 .  terbinafine (LAMISIL) 250 MG tablet, Take 1 tablet (250 mg total) by mouth daily. (Patient not taking: Reported on 11/23/2017), Disp: 90 tablet, Rfl: 0 .  Urea 39 % CREA, Apply to callused areas of feet once daily on Monday, Wednesday, and Fridays, Disp: 1 Bottle, Rfl: 4   Allergies  Allergen Reactions  . Acetaminophen Other (See Comments) and Shortness Of Breath    Coughing that makes him feel like throat is closing  . Eck Night Time  [Pseudoeph-Doxylamine-Dm-Apap] Shortness Of Breath    Other reaction(s): Cough  . Nsaids Anaphylaxis    Tolerated Ibuprofen  . Pseudoephedrine-Dm-Gg-Apap Shortness Of Breath    Other reaction(s): Cough  . Ciprofloxacin Swelling  . Penicillins Rash    Has patient had a PCN reaction causing immediate rash, facial/tongue/throat swelling, SOB or lightheadedness with hypotension: Yes Has patient had a PCN reaction causing severe rash involving mucus membranes or skin necrosis: No Has patient had a PCN reaction that required hospitalization: No Has patient had a PCN reaction occurring within the last 10 years: No If all of the above answers are "NO", then may proceed with  Cephalosporin use.  Unsure of reaction      Social History   Occupational History  . Not on file  Tobacco Use  . Smoking status: Current Some Day Smoker  . Smokeless tobacco: Never Used  Substance and Sexual Activity  . Alcohol use: Yes    Alcohol/week: 0.0 standard drinks    Comment: social  . Drug use: No  . Sexual activity: Not on file     History reviewed. No pertinent family history.   Immunization History  Administered Date(s) Administered  .  Tdap 09/08/2013     Review of systems: Positive Findings in bold print.  Constitutional:  chills, fatigue, fever, sweats, weight change Communication: Nurse, learning disability, sign Presenter, broadcasting, hand writing, iPad/Android device Head: headaches, head injury Eyes: changes in vision, eye pain, glaucoma, cataracts, macular degeneration, diplopia, glare,  light sensitivity, eyeglasses or contacts, blindness Ears nose mouth throat: Hard of hearing, ringing in ears, deaf, sign language,  vertigo,   nosebleeds,  rhinitis,  cold sores, snoring, swollen glands Cardiovascular: HTN, edema, arrhythmia, pacemaker in place, defibrillator in place,  chest pain/tightness, chronic anticoagulation, blood clot, heart failure Peripheral Vascular: leg cramps, varicose veins, blood clots, lymphedema Respiratory:  difficulty breathing, denies congestion, SOB, wheezing, cough, emphysema Gastrointestinal: change in appetite or weight, abdominal pain, constipation, diarrhea, nausea, vomiting, vomiting blood, change in bowel habits, abdominal pain, jaundice, rectal bleeding, hemorrhoids, Genitourinary:  nocturia,  pain on urination,  blood in urine, Foley catheter, urinary urgency Musculoskeletal: uses mobility aid,  cramping, stiff joints, painful joints, decreased joint motion, fractures, OA, gout Skin: +changes in toenails, color change, dryness, itching, mole changes,  rash  Neurological: headaches, numbness in feet, paresthesias in feet, burning in feet, fainting,  seizures, change in speech. denies headaches, memory problems/poor historian, cerebral palsy, weakness, paralysis Endocrine: diabetes, hypothyroidism, hyperthyroidism,  goiter, dry mouth, flushing, heat intolerance,  cold intolerance,  excessive thirst, denies polyuria,  nocturia Hematological:  easy bleeding, excessive bleeding, easy bruising, enlarged lymph nodes, on long term blood thinner, history of past transusions Allergy/immunological:  hives, eczema,  frequent infections, multiple drug allergies, seasonal allergies, transplant recipient Psychiatric:  anxiety, depression, mood disorder, suicidal ideations, hallucinations   Objective: Vitals:   09/02/18 0754  BP: (!) 141/87   Vascular Examination: Capillary refill time immediate x 10 digits Dorsalis pedis and posterior tibial pulses present b/l Digital hair present x 10 digits Skin temperature gradient within normal limits bilaterally  Dermatological Examination: Skin with normal turgor, texture and tone  Toenails 1 through 5 bilaterally are noted to be elongated, thickened, discolored, dystrophic with subungual debris.  Patient also noted to have ball tyloma left foot and hyperkeratotic lesion submetatarsal head 3 right foot, both with tenderness to palpation.  There is no erythema, no edema, no drainage noted.  Musculoskeletal: Muscle strength 5/5 to all LE muscle groups  There is moderate pes planus foot deformity noted bilaterally.  Neurological: Sensation intact with 10 gram monofilament. Vibratory sensation intact.  Assessment: Painful calluses bilateral Painful mycotic toenails 1 through 5 bilaterally NIDDM  Plan: 1. Patient to continue soft, supportive shoe gear.  In the meantime we will check his insurance for benefits to see whether he qualifies for diabetic inserts, diabetic shoes, or both.  I feel these devices can provide him some relief as he works a lot on his feet.  I will have Dawn Miner check benefits for him 2. Calluses pared bilaterally.  Patient given prescription for urea 39% cream to be applied once daily on Monday Wednesdays  and Fridays. 3. Toenails 1 through 5 bilaterally were debrided in length and girth without complication. 4. Patient to report any pedal injuries to medical professional immediately. 5. Follow up 3 months. Patient/POA to call should there be a concern in the interim.

## 2018-12-02 ENCOUNTER — Encounter: Payer: Self-pay | Admitting: Podiatry

## 2018-12-02 ENCOUNTER — Ambulatory Visit: Payer: BLUE CROSS/BLUE SHIELD | Admitting: Podiatry

## 2018-12-02 ENCOUNTER — Other Ambulatory Visit: Payer: Self-pay

## 2018-12-02 VITALS — Temp 98.4°F | Ht 74.0 in | Wt 340.0 lb

## 2018-12-02 DIAGNOSIS — M79674 Pain in right toe(s): Secondary | ICD-10-CM

## 2018-12-02 DIAGNOSIS — L84 Corns and callosities: Secondary | ICD-10-CM

## 2018-12-02 DIAGNOSIS — E119 Type 2 diabetes mellitus without complications: Secondary | ICD-10-CM

## 2018-12-02 DIAGNOSIS — M79675 Pain in left toe(s): Secondary | ICD-10-CM | POA: Diagnosis not present

## 2018-12-02 DIAGNOSIS — B351 Tinea unguium: Secondary | ICD-10-CM

## 2018-12-02 DIAGNOSIS — M2141 Flat foot [pes planus] (acquired), right foot: Secondary | ICD-10-CM

## 2018-12-02 DIAGNOSIS — M2142 Flat foot [pes planus] (acquired), left foot: Secondary | ICD-10-CM

## 2018-12-05 ENCOUNTER — Encounter: Payer: Self-pay | Admitting: Podiatry

## 2018-12-05 NOTE — Progress Notes (Signed)
Subjective: Rodney Delgado is a 33 y.o. y.o. male who presents today for preventative diabetic foot care on today with chronic cllosities and mycotic toenails b/l.  Pain is aggravated when weightbearing and/or wearing enclosed shoe gear. Pain is relieved with periodic professional debridement.  [facility], [facility]    Current Outpatient Medications:  .  albuterol (PROVENTIL HFA;VENTOLIN HFA) 108 (90 Base) MCG/ACT inhaler, Inhale 1-2 puffs into the lungs every 6 (six) hours as needed for wheezing or shortness of breath., Disp: 1 Inhaler, Rfl: 0 .  fluticasone (FLONASE) 50 MCG/ACT nasal spray, Place 1 spray into both nostrils daily., Disp: 16 g, Rfl: 2 .  ibuprofen (ADVIL,MOTRIN) 400 MG tablet, Take 1 tablet (400 mg total) by mouth every 4 (four) hours., Disp: 30 tablet, Rfl: 0 .  NON FORMULARY, [facility] Antifungal Cream Terbinaine 3%; Fluconazole 2%; Tea Tree Oil 5% Urea; 10% ibuprofen 2% in DMSO Suspension #30 mL Apply to infected nail once (at bedtime), Disp: , Rfl:  .  Urea 39 % CREA, Apply to callused areas of feet once daily on Monday, Wednesday, and Fridays, Disp: 1 Bottle, Rfl: 4  Allergies  Allergen Reactions  . Acetaminophen Other (See Comments) and Shortness Of Breath    Coughing that makes him feel like throat is closing  . Eck Night Time  [Pseudoeph-Doxylamine-Dm-Apap] Shortness Of Breath    Other reaction(s): Cough  . Nsaids Anaphylaxis    Tolerated Ibuprofen  . Pseudoephedrine-Dm-Gg-Apap Shortness Of Breath    Other reaction(s): Cough  . Ciprofloxacin Swelling  . Penicillins Rash    Has patient had a PCN reaction causing immediate rash, facial/tongue/throat swelling, SOB or lightheadedness with hypotension: Yes Has patient had a PCN reaction causing severe rash involving mucus membranes or skin necrosis: No Has patient had a PCN reaction that required hospitalization: No Has patient had a PCN reaction occurring within the last 10 years: No If all of the  above answers are "NO", then may proceed with Cephalosporin use.  Unsure of reaction     Objective:  Vascular Examination: Capillary refill time immediate x 10 digits.  Dorsalis pedis pulses palpable b/l.  Posterior tibial pulses palpable b/l.  Digital hair present x 10 digits.  Skin temperature gradient WNL b/l.  Dermatological Examination: Skin with normal turgor, texture and tone b/l.  Toenails 1-5 b/l discolored, thick, dystrophic with subungual debris and pain with palpation to nailbeds due to thickness of nails.  Hyperkeratotic lesion submet heads 2 left, 3 b/l 4 b/l and submet head 5 left foot. No erythema, no edema, no drainage, no flocculence noted.   Musculoskeletal: Muscle strength 5/5 to all LE muscle groups  Pes planus b/l  Neurological: Sensation intact with 10 gram monofilament.  Vibratory sensation intact b/l.  Assessment: 1.  Painful onychomycosis toenails 1-5 b/l 2.  Callus submet heads 2 left, 3 b/l 4 b/l and submet head 5 left foot 3.  Pes planus b/l 4.  NIDDM  Plan: 1. Continue diabetic foot care principles. Literature dispensed on today. 2. Discussed orthotic therapy for offloading callosities. He was casted on today. He would like Dawn from [employer] [facility] to clarify his benefits with him. 3. Toenails 1-5 b/l were debrided in length and girth without iatrogenic bleeding. 4. Hyperkeratotic lesion(s) submet heads 2 left, 3 b/l,  4 b/l and submet head 5 left foot pared with sterile scalpel blade without incident. 5. Patient to continue soft, supportive shoe gear daily. He is to bring work boots to next  visit. 6. Patient to report any pedal injuries to medical professional immediately. 7. Follow up 9 weeks.  8. Patient/POA to call should there be a concern in the interim.

## 2019-08-26 IMAGING — CT CT ABD-PELV W/ CM
2 of 7 series · 14 of 46 positions shown, 18 images · IV contrast (ISOVUE)
Comparison: None.

CLINICAL DATA: Right buttock abscess for 4 days.

EXAM:
CT ABDOMEN AND PELVIS WITH CONTRAST
TECHNIQUE: Multidetector CT imaging of the abdomen and pelvis was performed
using the standard protocol following bolus administration of
intravenous contrast.
CONTRAST:  100 cc [id number] IOPAMIDOL ([id number]) INJECTION 61%

[Series 2: axial st · axial · 0.81mm/px · z∈[+1208,+1688]mm · 11 of 110 slices shown, 15 images]
[im 7/110  soft-tissue]
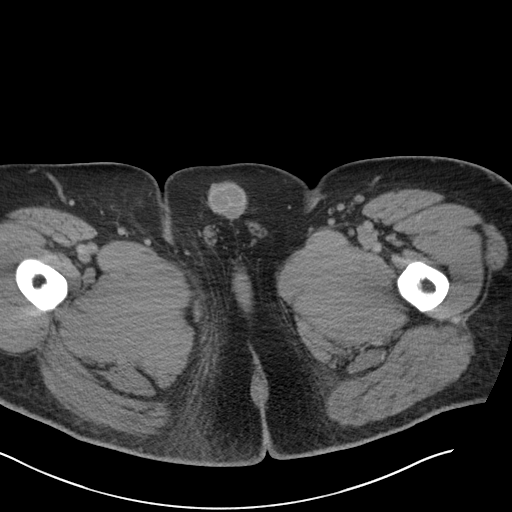
[im 7/110  bone]
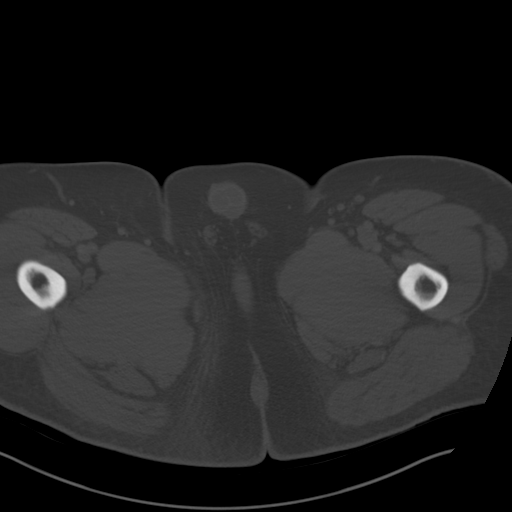
[im 20/110  soft-tissue]
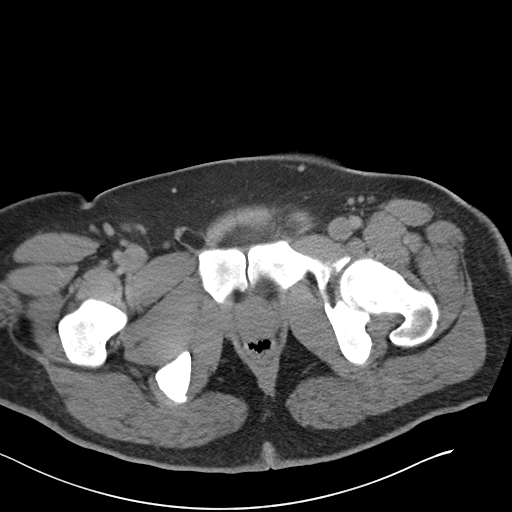
[im 33/110  soft-tissue]
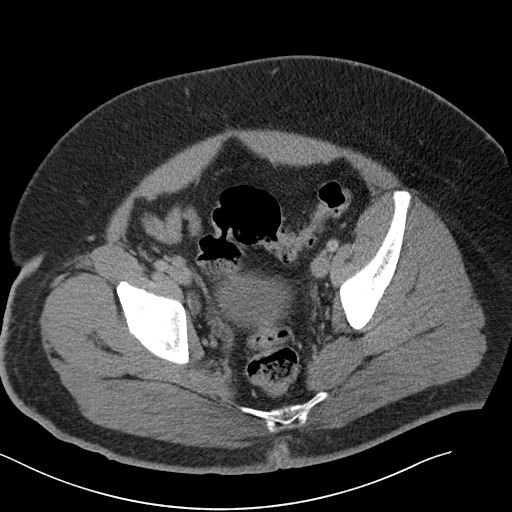
[im 45/110  soft-tissue]
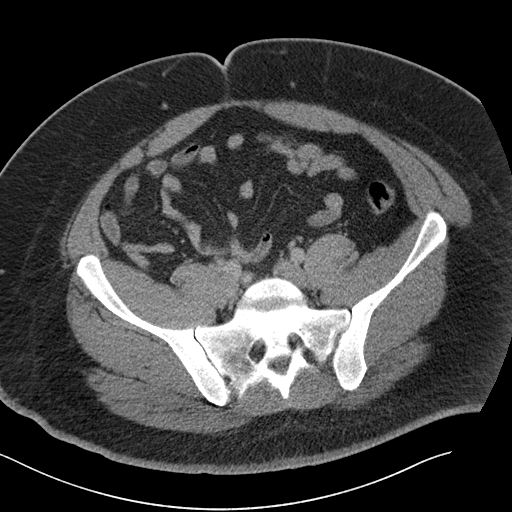
[im 58/110  soft-tissue]
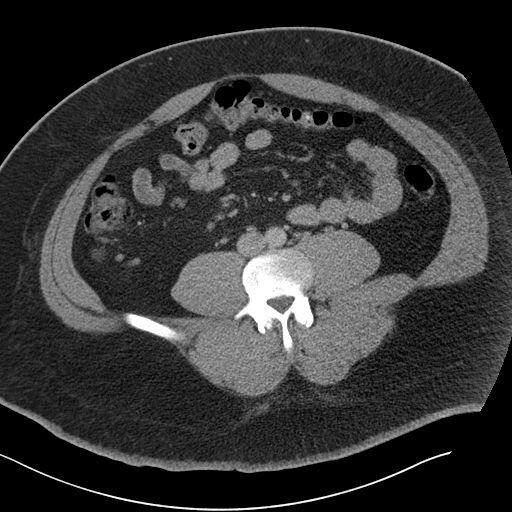
[im 65/110  soft-tissue]
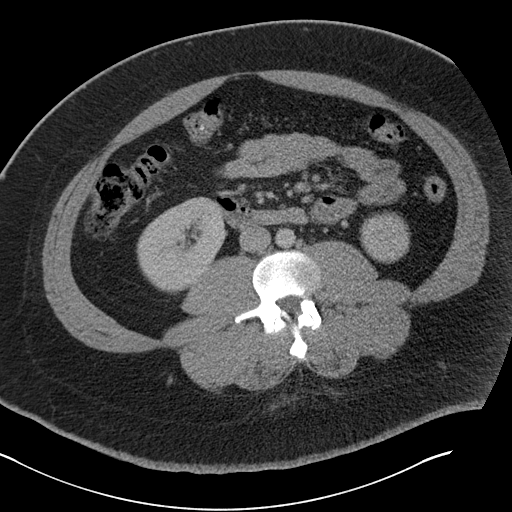
[im 77/110  soft-tissue]
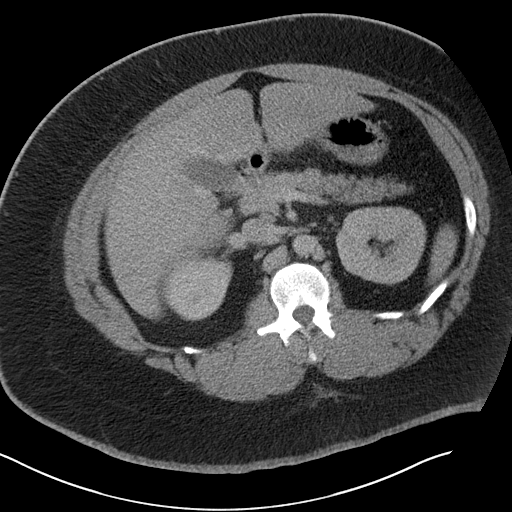
[im 84/110  lung]
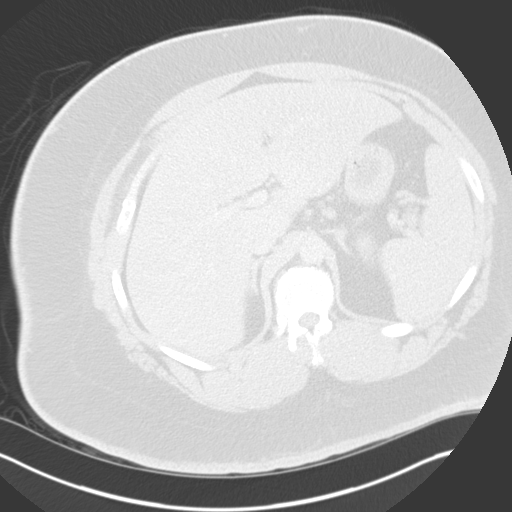
[im 90/110  soft-tissue]
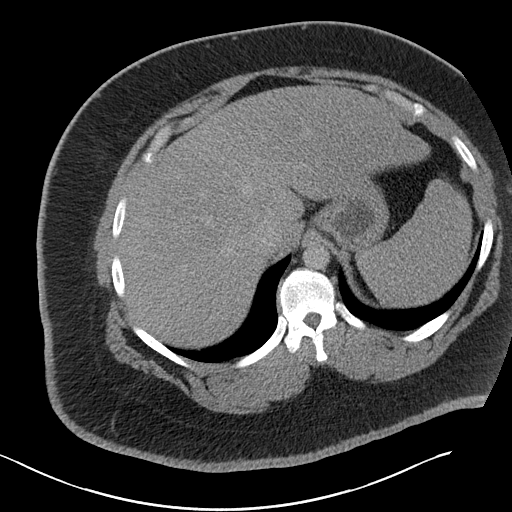
[im 90/110  lung]
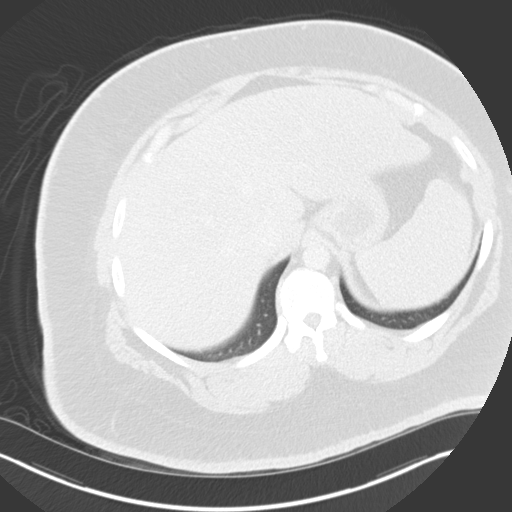
[im 97/110  lung]
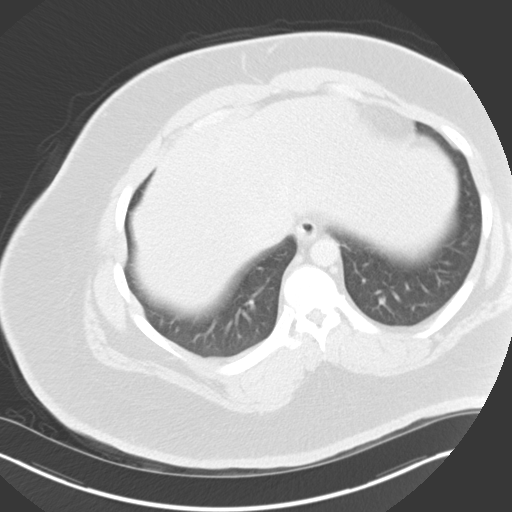
[im 103/110  soft-tissue]
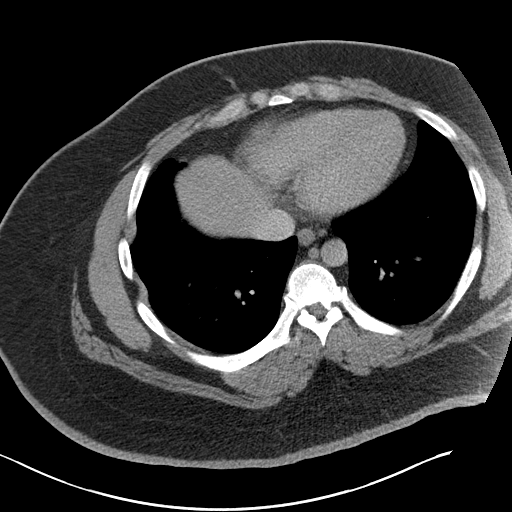
[im 103/110  lung]
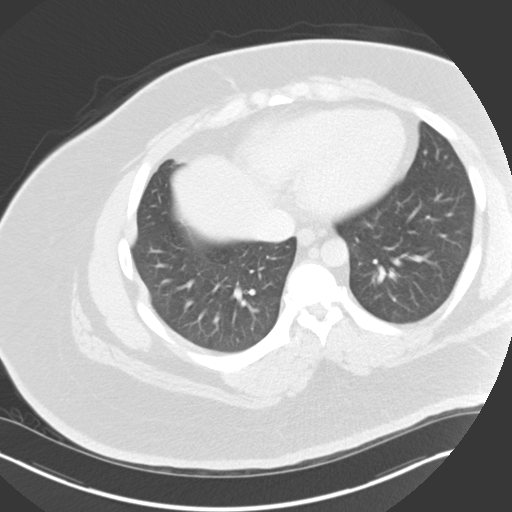
[im 103/110  bone]
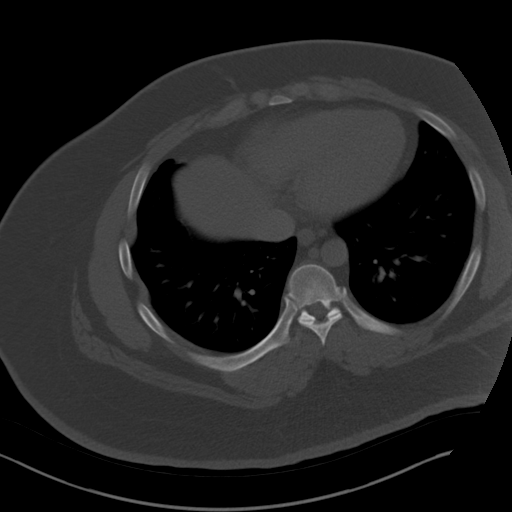

[Series 7: coronal st · coronal · 0.81mm/px · 3 of 114 slices shown]
[im 23/114  soft-tissue]
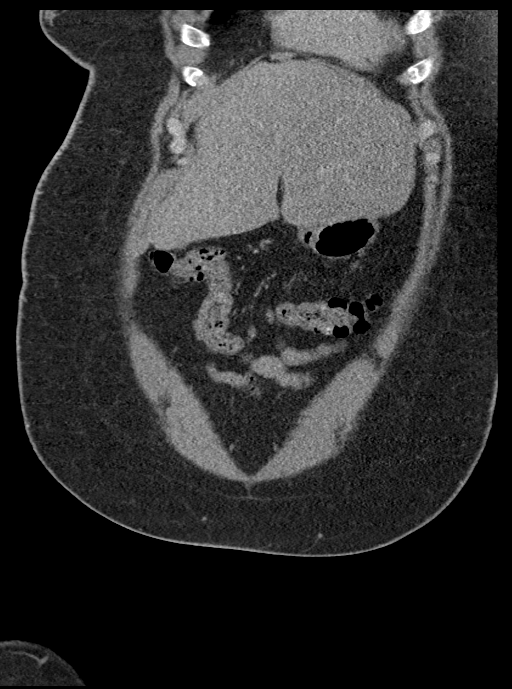
[im 46/114  soft-tissue]
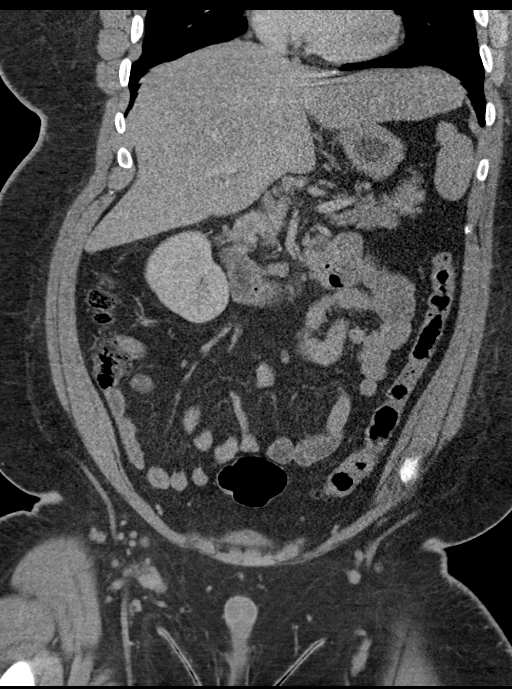
[im 68/114  soft-tissue]
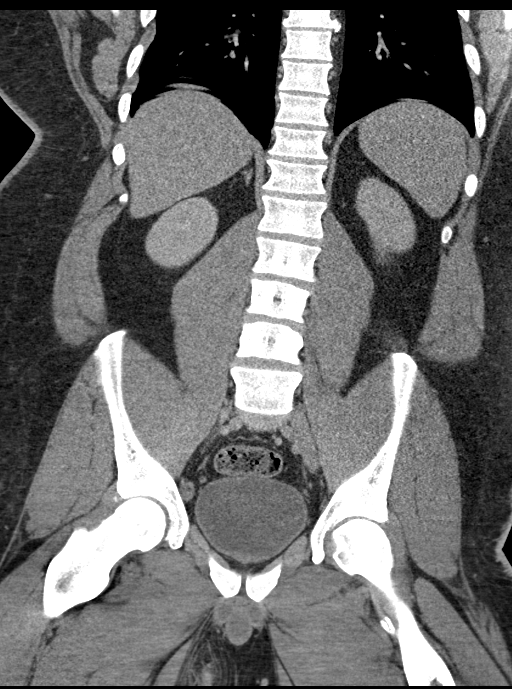

[14 of 46 positions shown; findings below may reference images not displayed]

FINDINGS: Lower chest: Normal heart size without pericardial effusion. Clear
lung bases.

Hepatobiliary: Homogeneous appearance of the liver without
space-occupying mass. Normal gallbladder. No biliary dilatation.

Pancreas: Normal

Spleen: Normal

Adrenals/Urinary Tract: Normal

Stomach/Bowel: Colonic diverticulosis along the sigmoid colon
without acute diverticulitis. Normal appendix. Unremarkable stomach
and small intestine.

Vascular/Lymphatic: Normal caliber aorta and iliac arteries. No
pelvic sidewall lymphadenopathy or inguinal adenopathy.

Reproductive: Unremarkable

Other: Subcutaneous abscess at the junction of the right buttock and
perineum measuring 2.9 x 7.4 x 4.5 cm with cellulitic subcutaneous
soft tissue induration involving the adjacent buttock.

Musculoskeletal: No acute or significant osseous findings.
IMPRESSION: 1. Subcutaneous abscess at the junction of the right buttock and
perineum measuring 2.9 x 7.4 x 4.5 cm with adjacent cellulitis.
2. Colonic diverticulosis without acute diverticulitis.
3. No acute solid organ pathology or adenopathy.
